# Patient Record
Sex: Female | Born: 1945 | Race: Black or African American | Hispanic: No | Smoking: Never smoker
Health system: Southern US, Community
[De-identification: ages and names within clinical notes are randomized; demographics above are authoritative.]

## PROBLEM LIST (undated history)

## (undated) DIAGNOSIS — R0600 Dyspnea, unspecified: Secondary | ICD-10-CM

## (undated) DIAGNOSIS — I10 Essential (primary) hypertension: Secondary | ICD-10-CM

## (undated) DIAGNOSIS — R079 Chest pain, unspecified: Secondary | ICD-10-CM

## (undated) DIAGNOSIS — M1990 Unspecified osteoarthritis, unspecified site: Secondary | ICD-10-CM

## (undated) DIAGNOSIS — M8580 Other specified disorders of bone density and structure, unspecified site: Secondary | ICD-10-CM

## (undated) DIAGNOSIS — N390 Urinary tract infection, site not specified: Secondary | ICD-10-CM

## (undated) DIAGNOSIS — K219 Gastro-esophageal reflux disease without esophagitis: Secondary | ICD-10-CM

## (undated) DIAGNOSIS — R739 Hyperglycemia, unspecified: Secondary | ICD-10-CM

## (undated) DIAGNOSIS — N871 Moderate cervical dysplasia: Secondary | ICD-10-CM

## (undated) HISTORY — DX: Essential (primary) hypertension: I10

## (undated) HISTORY — DX: Urinary tract infection, site not specified: N39.0

## (undated) HISTORY — DX: Gastro-esophageal reflux disease without esophagitis: K21.9

## (undated) HISTORY — PX: BREAST EXCISIONAL BIOPSY: SUR124

## (undated) HISTORY — PX: OOPHORECTOMY: SHX86

## (undated) HISTORY — DX: Chest pain, unspecified: R07.9

## (undated) HISTORY — PX: ABDOMINAL HYSTERECTOMY: SHX81

## (undated) HISTORY — DX: Unspecified osteoarthritis, unspecified site: M19.90

## (undated) HISTORY — DX: Other specified disorders of bone density and structure, unspecified site: M85.80

## (undated) HISTORY — DX: Hyperglycemia, unspecified: R73.9

## (undated) HISTORY — DX: Moderate cervical dysplasia: N87.1

---

## 1985-05-01 HISTORY — PX: BREAST BIOPSY: SHX20

## 2008-01-29 ENCOUNTER — Ambulatory Visit: Payer: Self-pay

## 2008-07-01 ENCOUNTER — Ambulatory Visit: Payer: Self-pay | Admitting: Family Medicine

## 2008-09-14 ENCOUNTER — Ambulatory Visit: Payer: Self-pay | Admitting: Orthopedic Surgery

## 2009-07-27 ENCOUNTER — Ambulatory Visit: Payer: Self-pay | Admitting: Family Medicine

## 2009-08-02 ENCOUNTER — Ambulatory Visit: Payer: Self-pay | Admitting: Otolaryngology

## 2010-05-18 ENCOUNTER — Ambulatory Visit: Payer: Self-pay | Admitting: Ophthalmology

## 2010-06-29 ENCOUNTER — Ambulatory Visit: Payer: Self-pay | Admitting: Ophthalmology

## 2010-08-09 ENCOUNTER — Ambulatory Visit: Payer: Self-pay | Admitting: Family Medicine

## 2011-08-10 ENCOUNTER — Ambulatory Visit: Payer: Self-pay | Admitting: Family Medicine

## 2012-04-19 ENCOUNTER — Ambulatory Visit: Payer: Self-pay | Admitting: Internal Medicine

## 2012-08-13 ENCOUNTER — Ambulatory Visit: Payer: Self-pay | Admitting: Family Medicine

## 2012-08-19 ENCOUNTER — Ambulatory Visit: Payer: Self-pay

## 2015-10-29 ENCOUNTER — Ambulatory Visit: Payer: Self-pay | Admitting: Oncology

## 2015-11-05 ENCOUNTER — Encounter: Payer: Self-pay | Admitting: Oncology

## 2015-11-05 ENCOUNTER — Inpatient Hospital Stay: Payer: Medicare Other | Attending: Oncology | Admitting: Oncology

## 2015-11-05 VITALS — BP 148/78 | HR 73 | Temp 98.1°F | Resp 18 | Ht 64.0 in | Wt 159.7 lb

## 2015-11-05 DIAGNOSIS — R739 Hyperglycemia, unspecified: Secondary | ICD-10-CM

## 2015-11-05 DIAGNOSIS — D72819 Decreased white blood cell count, unspecified: Secondary | ICD-10-CM | POA: Insufficient documentation

## 2015-11-05 DIAGNOSIS — N390 Urinary tract infection, site not specified: Secondary | ICD-10-CM

## 2015-11-05 DIAGNOSIS — G8929 Other chronic pain: Secondary | ICD-10-CM

## 2015-11-05 DIAGNOSIS — M79601 Pain in right arm: Secondary | ICD-10-CM | POA: Diagnosis not present

## 2015-11-05 DIAGNOSIS — K219 Gastro-esophageal reflux disease without esophagitis: Secondary | ICD-10-CM | POA: Diagnosis not present

## 2015-11-05 DIAGNOSIS — D696 Thrombocytopenia, unspecified: Secondary | ICD-10-CM | POA: Insufficient documentation

## 2015-11-05 DIAGNOSIS — D709 Neutropenia, unspecified: Secondary | ICD-10-CM | POA: Insufficient documentation

## 2015-11-05 DIAGNOSIS — M8580 Other specified disorders of bone density and structure, unspecified site: Secondary | ICD-10-CM | POA: Insufficient documentation

## 2015-11-05 DIAGNOSIS — M129 Arthropathy, unspecified: Secondary | ICD-10-CM | POA: Insufficient documentation

## 2015-11-05 DIAGNOSIS — M79602 Pain in left arm: Secondary | ICD-10-CM | POA: Insufficient documentation

## 2015-11-05 DIAGNOSIS — Z7982 Long term (current) use of aspirin: Secondary | ICD-10-CM | POA: Insufficient documentation

## 2015-11-05 DIAGNOSIS — I10 Essential (primary) hypertension: Secondary | ICD-10-CM | POA: Diagnosis not present

## 2015-11-05 DIAGNOSIS — R079 Chest pain, unspecified: Secondary | ICD-10-CM

## 2015-11-05 DIAGNOSIS — Z79899 Other long term (current) drug therapy: Secondary | ICD-10-CM

## 2015-11-05 DIAGNOSIS — Z8541 Personal history of malignant neoplasm of cervix uteri: Secondary | ICD-10-CM

## 2015-11-05 LAB — CBC WITH DIFFERENTIAL/PLATELET
Basophils Absolute: 0.1 K/uL (ref 0–0.1)
Basophils Relative: 1 %
Eosinophils Absolute: 0.2 K/uL (ref 0–0.7)
Eosinophils Relative: 3 %
HCT: 38.3 % (ref 35.0–47.0)
Hemoglobin: 12.6 g/dL (ref 12.0–16.0)
Lymphocytes Relative: 54 %
Lymphs Abs: 3.1 K/uL (ref 1.0–3.6)
MCH: 28.1 pg (ref 26.0–34.0)
MCHC: 32.8 g/dL (ref 32.0–36.0)
MCV: 85.6 fL (ref 80.0–100.0)
Monocytes Absolute: 0.6 K/uL (ref 0.2–0.9)
Monocytes Relative: 10 %
Neutro Abs: 1.8 K/uL (ref 1.4–6.5)
Neutrophils Relative %: 32 %
Platelets: 137 K/uL — ABNORMAL LOW (ref 150–440)
RBC: 4.47 MIL/uL (ref 3.80–5.20)
RDW: 16.4 % — ABNORMAL HIGH (ref 11.5–14.5)
WBC: 5.8 K/uL (ref 3.6–11.0)

## 2015-11-05 LAB — LACTATE DEHYDROGENASE: LDH: 163 U/L (ref 98–192)

## 2015-11-05 LAB — IRON AND TIBC
IRON: 62 ug/dL (ref 28–170)
SATURATION RATIOS: 19 % (ref 10.4–31.8)
TIBC: 335 ug/dL (ref 250–450)
UIBC: 273 ug/dL

## 2015-11-05 LAB — FERRITIN: Ferritin: 112 ng/mL (ref 11–307)

## 2015-11-05 NOTE — Progress Notes (Signed)
New evaluation for neutropenia. Pt complaints of bilateral arm pain that is chronic.

## 2015-11-06 NOTE — Progress Notes (Signed)
[facility]  [phone] [phone]) [phone]  ID: Lori Deleon OB: 09-Mar-1946  MR#: [id number]  MVE#:[id number]  No care team member to display  CHIEF COMPLAINT:  Chief Complaint  Patient presents with  . Neutropenia    INTERVAL HISTORY: Patient is a 70 year old female who was found to have a normal white blood cell count, but did have persistant neutropenia on routine blood work. She has bilateral arm pain that is chronic and unchanged but otherwise feels well and is asymptomatic. She has no neurologic complaints. She denies any recent fevers or illnesses. She has no new medications. She has a good appetite and denies weight loss. She denies any night sweats. She denies any nausea, vomiting, constipation, or diarrhea. She has no urinary complaints. Patient feels at her baseline and offers no specific complaints today.  REVIEW OF SYSTEMS:   Review of Systems  Constitutional: Negative.  Negative for fever, weight loss and malaise/fatigue.  Respiratory: Negative.  Negative for cough and shortness of breath.   Cardiovascular: Negative.  Negative for chest pain.  Gastrointestinal: Negative.  Negative for abdominal pain.  Genitourinary: Negative.  Negative for dysuria and flank pain.  Musculoskeletal: Positive for joint pain.  Neurological: Negative.  Negative for weakness.  Psychiatric/Behavioral: Negative.  The patient is not nervous/anxious.     As per HPI. Otherwise, a complete review of systems is negatve.  PAST MEDICAL HISTORY: Past Medical History  Diagnosis Date  . Esophageal reflux   . Osteopenia   . Hypertension   . GERD (gastroesophageal reflux disease)   . Arthritis   . Intermittent chest pain   . Hyperglycemia   . CIN II (cervical intraepithelial neoplasia II)   . UTI (lower urinary tract infection)     PAST SURGICAL HISTORY: No past surgical history on file.  FAMILY HISTORY: Reviewed and unchanged. No reported history of  malignancy or chronic disease.     ADVANCED DIRECTIVES:    HEALTH MAINTENANCE: Social History  Substance Use Topics  . Smoking status: Not on file  . Smokeless tobacco: Not on file  . Alcohol Use: Not on file     Colonoscopy:  PAP:  Bone density:  Lipid panel:  Allergies  Allergen Reactions  . Sulfa Antibiotics Rash    Current Outpatient Prescriptions  Medication Sig Dispense Refill  . amLODipine (NORVASC) 10 MG tablet Take 1 tablet by mouth daily.    Marland Kitchen aspirin 81 MG tablet Take 81 mg by mouth daily.    . calcium carbonate (OS-CAL) 600 MG TABS tablet Take 600 mg by mouth daily with breakfast.    . Cholecalciferol (VITAMIN D-3) 1000 units CAPS Take 1 capsule by mouth daily.    Marland Kitchen dexlansoprazole (DEXILANT) 60 MG capsule Take 60 mg by mouth daily.    . hydroxypropyl methylcellulose / hypromellose (ISOPTO TEARS / GONIOVISC) 2.5 % ophthalmic solution Place 2 drops into both eyes as needed for dry eyes.    . Multiple Vitamin (MULTIVITAMIN) tablet Take 1 tablet by mouth daily.    Marland Kitchen omega-3 fish oil (MAXEPA) 1000 MG CAPS capsule Take 1 capsule by mouth 2 (two) times daily.    . ranitidine (ZANTAC) 150 MG tablet Take 300 mg by mouth at bedtime.     No current facility-administered medications for this visit.    OBJECTIVE: Filed Vitals:   11/05/15 1432  BP: 148/78  Pulse: 73  Temp: 98.1 F (36.7 C)  Resp: 18     Body mass index is 27.4 kg/(m^2).  ECOG FS:0 - Asymptomatic  General: Well-developed, well-nourished, no acute distress. Eyes: Pink conjunctiva, anicteric sclera. HEENT: Normocephalic, moist mucous membranes, clear oropharnyx. Lungs: Clear to auscultation bilaterally. Heart: Regular rate and rhythm. No rubs, murmurs, or gallops. Abdomen: Soft, nontender, nondistended. No organomegaly noted, normoactive bowel sounds. Musculoskeletal: No edema, cyanosis, or clubbing. Neuro: Alert, answering all questions appropriately. Cranial nerves grossly intact. Skin: No  rashes or petechiae noted. Psych: Normal affect. Lymphatics: No cervical, calvicular, axillary or inguinal LAD.   LAB RESULTS:  No results found for: NA, K, CL, CO2, GLUCOSE, BUN, CREATININE, CALCIUM, PROT, ALBUMIN, AST, ALT, ALKPHOS, BILITOT, GFRNONAA, GFRAA  Lab Results  Component Value Date   WBC 5.8 11/05/2015   NEUTROABS 1.8 11/05/2015   HGB 12.6 11/05/2015   HCT 38.3 11/05/2015   MCV 85.6 11/05/2015   PLT 137* 11/05/2015     STUDIES: No results found.  ASSESSMENT: Neutropenia without leukopenia.  PLAN:    1. Neutropenia: Patient's white blood cell count continues to be within normal limits with a mild neutropenia. Patient is asymptomatic. All of her blood work is either negative or within normal limits. Peripheral blood flow cytometry as well as antineutrophil antibodies are pending at time of dictation. No intervention is needed at this time. Patient does not require bone marrow biopsy. Return to clinic in 3 months with repeat laboratory work and further evaluation. 2. Thrombocytopenia: Mild, monitor. 3. Hypertension: Patient's blood pressure is mildly elevated today, continue current medications as prescribed.  Patient expressed understanding and was in agreement with this plan. She also understands that She can call clinic at any time with any questions, concerns, or complaints.    Lloyd Huger, MD   11/06/2015 7:23 AM

## 2015-11-12 LAB — COMP PANEL: LEUKEMIA/LYMPHOMA

## 2015-11-16 LAB — NEUTROPHIL AB TEST LEVEL 1: NEUTROPHIL SCR/PANEL INTERP.: NEGATIVE

## 2015-12-08 ENCOUNTER — Other Ambulatory Visit: Payer: Self-pay | Admitting: Family Medicine

## 2015-12-08 DIAGNOSIS — Z1231 Encounter for screening mammogram for malignant neoplasm of breast: Secondary | ICD-10-CM

## 2015-12-27 ENCOUNTER — Other Ambulatory Visit: Payer: Self-pay | Admitting: Family Medicine

## 2015-12-27 ENCOUNTER — Ambulatory Visit
Admission: RE | Admit: 2015-12-27 | Discharge: 2015-12-27 | Disposition: A | Discharge status: Home / Self Care | Payer: Medicare Other | Source: Ambulatory Visit | Attending: Family Medicine | Admitting: Family Medicine

## 2015-12-27 DIAGNOSIS — Z1231 Encounter for screening mammogram for malignant neoplasm of breast: Secondary | ICD-10-CM | POA: Diagnosis present

## 2016-02-02 NOTE — Progress Notes (Signed)
[name]  [phone] [phone]  ID: Lori Deleon OB: [birth year]  MR#: [id number]  QQV#:[id number]  No care team member to display  CHIEF COMPLAINT: Neutropenia without leukopenia.  INTERVAL HISTORY: Patient returns to clinic today for repeat laboratory work and further evaluation. She currently feels well and is asymptomatic. She has no neurologic complaints. She denies any recent fevers or illnesses. She has a good appetite and denies weight loss. She denies any night sweats. She denies any nausea, vomiting, constipation, or diarrhea. She has no urinary complaints. Patient feels at her baseline and offers no specific complaints today.  REVIEW OF SYSTEMS:   Review of Systems  Constitutional: Negative.  Negative for fever, malaise/fatigue and weight loss.  Respiratory: Negative.  Negative for cough and shortness of breath.   Cardiovascular: Negative.  Negative for chest pain.  Gastrointestinal: Negative.  Negative for abdominal pain.  Genitourinary: Negative.  Negative for dysuria and flank pain.  Musculoskeletal: Positive for joint pain.  Neurological: Negative.  Negative for weakness.  Psychiatric/Behavioral: Negative.  The patient is not nervous/anxious.     As per HPI. Otherwise, a complete review of systems is negative.  PAST MEDICAL HISTORY: Past Medical History:  Diagnosis Date  . Arthritis   . CIN II (cervical intraepithelial neoplasia II)   . Esophageal reflux   . GERD (gastroesophageal reflux disease)   . Hyperglycemia   . Hypertension   . Intermittent chest pain   . Osteopenia   . UTI (lower urinary tract infection)     PAST SURGICAL HISTORY: Past Surgical History:  Procedure Laterality Date  . BREAST BIOPSY Left 1987   neg    FAMILY HISTORY: Reviewed and unchanged. No reported history of malignancy or chronic disease.     ADVANCED DIRECTIVES:    HEALTH MAINTENANCE: Social History  Substance Use Topics    . Smoking status: Not on file  . Smokeless tobacco: Not on file  . Alcohol use Not on file     Colonoscopy:  PAP:  Bone density:  Lipid panel:  Allergies  Allergen Reactions  . Sulfa Antibiotics Rash    Current Outpatient Prescriptions  Medication Sig Dispense Refill  . amLODipine (NORVASC) 10 MG tablet Take 1 tablet by mouth daily.    Marland Kitchen aspirin 81 MG tablet Take 81 mg by mouth daily.    . calcium carbonate (OS-CAL) 600 MG TABS tablet Take 600 mg by mouth daily with breakfast.    . Cholecalciferol (VITAMIN D-3) 1000 units CAPS Take 1 capsule by mouth daily.    Marland Kitchen dexlansoprazole (DEXILANT) 60 MG capsule Take 60 mg by mouth daily.    . hydroxypropyl methylcellulose / hypromellose (ISOPTO TEARS / GONIOVISC) 2.5 % ophthalmic solution Place 2 drops into both eyes as needed for dry eyes.    . Multiple Vitamin (MULTIVITAMIN) tablet Take 1 tablet by mouth daily.    Marland Kitchen omega-3 fish oil (MAXEPA) 1000 MG CAPS capsule Take 1 capsule by mouth 2 (two) times daily.    . ranitidine (ZANTAC) 150 MG tablet Take 300 mg by mouth at bedtime.     No current facility-administered medications for this visit.     OBJECTIVE: There were no vitals filed for this visit.   There is no height or weight on file to calculate BMI.    ECOG FS:0 - Asymptomatic  General: Well-developed, well-nourished, no acute distress. Eyes: Pink conjunctiva, anicteric sclera. Lungs: Clear to auscultation bilaterally. Heart: Regular rate and rhythm. No rubs, murmurs, or gallops.  Abdomen: Soft, nontender, nondistended. No organomegaly noted, normoactive bowel sounds. Musculoskeletal: No edema, cyanosis, or clubbing. Neuro: Alert, answering all questions appropriately. Cranial nerves grossly intact. Skin: No rashes or petechiae noted. Psych: Normal affect. Lymphatics: No cervical, calvicular, axillary or inguinal LAD.   LAB RESULTS:  No results found for: NA, K, CL, CO2, GLUCOSE, BUN, CREATININE, CALCIUM, PROT, ALBUMIN,  AST, ALT, ALKPHOS, BILITOT, GFRNONAA, GFRAA  Lab Results  Component Value Date   WBC 5.4 02/04/2016   NEUTROABS 1.4 02/04/2016   HGB 13.7 02/04/2016   HCT 41.5 02/04/2016   MCV 84.6 02/04/2016   PLT 139 (L) 02/04/2016     STUDIES: No results found.  ASSESSMENT: Neutropenia without leukopenia.  PLAN:    1. Neutropenia without leukopenia: Patient's white blood cell count continues to be within normal limits with a mild neutropenia. Patient is asymptomatic. All of her blood work is either negative or within normal limits, Including peripheral blood flow cytometry and antineutrophil antibodies. No intervention is needed at this time. Patient does not require bone marrow biopsy. After discussion with the patient, it was agreed upon that no further follow-up is necessary. Please refer patient back if she becomes symptomatic or there are any questions or concerns. 2. Thrombocytopenia: Mild, monitor. 3. Hypertension: Patient's blood pressure is mildly elevated today, continue current medications as prescribed.  Patient expressed understanding and was in agreement with this plan. She also understands that She can call clinic at any time with any questions, concerns, or complaints.    Lloyd Huger, MD   02/06/2016 5:30 PM

## 2016-02-04 ENCOUNTER — Inpatient Hospital Stay: Payer: Medicare Other | Attending: Oncology

## 2016-02-04 ENCOUNTER — Inpatient Hospital Stay (HOSPITAL_BASED_OUTPATIENT_CLINIC_OR_DEPARTMENT_OTHER): Payer: Medicare Other | Admitting: Oncology

## 2016-02-04 DIAGNOSIS — D696 Thrombocytopenia, unspecified: Secondary | ICD-10-CM | POA: Insufficient documentation

## 2016-02-04 DIAGNOSIS — E785 Hyperlipidemia, unspecified: Secondary | ICD-10-CM | POA: Insufficient documentation

## 2016-02-04 DIAGNOSIS — Z7982 Long term (current) use of aspirin: Secondary | ICD-10-CM

## 2016-02-04 DIAGNOSIS — K219 Gastro-esophageal reflux disease without esophagitis: Secondary | ICD-10-CM | POA: Diagnosis not present

## 2016-02-04 DIAGNOSIS — Z87440 Personal history of urinary (tract) infections: Secondary | ICD-10-CM

## 2016-02-04 DIAGNOSIS — M129 Arthropathy, unspecified: Secondary | ICD-10-CM | POA: Diagnosis not present

## 2016-02-04 DIAGNOSIS — R079 Chest pain, unspecified: Secondary | ICD-10-CM | POA: Insufficient documentation

## 2016-02-04 DIAGNOSIS — N871 Moderate cervical dysplasia: Secondary | ICD-10-CM | POA: Diagnosis not present

## 2016-02-04 DIAGNOSIS — M8580 Other specified disorders of bone density and structure, unspecified site: Secondary | ICD-10-CM | POA: Insufficient documentation

## 2016-02-04 DIAGNOSIS — Z79899 Other long term (current) drug therapy: Secondary | ICD-10-CM | POA: Diagnosis not present

## 2016-02-04 DIAGNOSIS — I10 Essential (primary) hypertension: Secondary | ICD-10-CM | POA: Insufficient documentation

## 2016-02-04 DIAGNOSIS — D72819 Decreased white blood cell count, unspecified: Secondary | ICD-10-CM

## 2016-02-04 DIAGNOSIS — D709 Neutropenia, unspecified: Secondary | ICD-10-CM | POA: Insufficient documentation

## 2016-02-04 LAB — CBC WITH DIFFERENTIAL/PLATELET
Basophils Absolute: 0 10*3/uL (ref 0–0.1)
Basophils Relative: 1 %
Eosinophils Absolute: 0.2 10*3/uL (ref 0–0.7)
Eosinophils Relative: 4 %
HEMATOCRIT: 41.5 % (ref 35.0–47.0)
Hemoglobin: 13.7 g/dL (ref 12.0–16.0)
LYMPHS ABS: 3.3 10*3/uL (ref 1.0–3.6)
LYMPHS PCT: 60 %
MCH: 27.9 pg (ref 26.0–34.0)
MCHC: 33 g/dL (ref 32.0–36.0)
MCV: 84.6 fL (ref 80.0–100.0)
MONO ABS: 0.5 10*3/uL (ref 0.2–0.9)
MONOS PCT: 9 %
NEUTROS ABS: 1.4 10*3/uL (ref 1.4–6.5)
Neutrophils Relative %: 26 %
Platelets: 139 10*3/uL — ABNORMAL LOW (ref 150–440)
RBC: 4.91 MIL/uL (ref 3.80–5.20)
RDW: 16 % — AB (ref 11.5–14.5)
WBC: 5.4 10*3/uL (ref 3.6–11.0)

## 2016-02-04 NOTE — Progress Notes (Signed)
Offers no complaints. States is feeling well. 

## 2016-05-16 DIAGNOSIS — I10 Essential (primary) hypertension: Secondary | ICD-10-CM | POA: Diagnosis not present

## 2016-05-25 DIAGNOSIS — Z1211 Encounter for screening for malignant neoplasm of colon: Secondary | ICD-10-CM | POA: Diagnosis not present

## 2016-05-25 DIAGNOSIS — Z86010 Personal history of colonic polyps: Secondary | ICD-10-CM | POA: Diagnosis not present

## 2016-05-25 DIAGNOSIS — Z7982 Long term (current) use of aspirin: Secondary | ICD-10-CM | POA: Diagnosis not present

## 2016-05-25 DIAGNOSIS — D123 Benign neoplasm of transverse colon: Secondary | ICD-10-CM | POA: Diagnosis not present

## 2016-05-25 DIAGNOSIS — K5730 Diverticulosis of large intestine without perforation or abscess without bleeding: Secondary | ICD-10-CM | POA: Diagnosis not present

## 2016-05-25 DIAGNOSIS — D369 Benign neoplasm, unspecified site: Secondary | ICD-10-CM | POA: Diagnosis not present

## 2016-05-25 DIAGNOSIS — I10 Essential (primary) hypertension: Secondary | ICD-10-CM | POA: Diagnosis not present

## 2016-05-31 DIAGNOSIS — I10 Essential (primary) hypertension: Secondary | ICD-10-CM | POA: Diagnosis not present

## 2016-05-31 DIAGNOSIS — M8580 Other specified disorders of bone density and structure, unspecified site: Secondary | ICD-10-CM | POA: Diagnosis not present

## 2016-06-01 DIAGNOSIS — R9431 Abnormal electrocardiogram [ECG] [EKG]: Secondary | ICD-10-CM | POA: Diagnosis not present

## 2016-06-01 DIAGNOSIS — Z87448 Personal history of other diseases of urinary system: Secondary | ICD-10-CM | POA: Diagnosis not present

## 2016-06-01 DIAGNOSIS — R7989 Other specified abnormal findings of blood chemistry: Secondary | ICD-10-CM | POA: Diagnosis not present

## 2016-06-01 DIAGNOSIS — I10 Essential (primary) hypertension: Secondary | ICD-10-CM | POA: Diagnosis not present

## 2016-06-01 DIAGNOSIS — R0789 Other chest pain: Secondary | ICD-10-CM | POA: Diagnosis not present

## 2016-06-01 DIAGNOSIS — M8580 Other specified disorders of bone density and structure, unspecified site: Secondary | ICD-10-CM | POA: Diagnosis not present

## 2016-06-15 DIAGNOSIS — I10 Essential (primary) hypertension: Secondary | ICD-10-CM | POA: Diagnosis not present

## 2016-06-15 DIAGNOSIS — R739 Hyperglycemia, unspecified: Secondary | ICD-10-CM | POA: Diagnosis not present

## 2016-06-15 DIAGNOSIS — R7989 Other specified abnormal findings of blood chemistry: Secondary | ICD-10-CM | POA: Diagnosis not present

## 2016-06-15 DIAGNOSIS — Z87448 Personal history of other diseases of urinary system: Secondary | ICD-10-CM | POA: Diagnosis not present

## 2016-06-15 DIAGNOSIS — R0789 Other chest pain: Secondary | ICD-10-CM | POA: Diagnosis not present

## 2016-06-23 DIAGNOSIS — R0789 Other chest pain: Secondary | ICD-10-CM | POA: Diagnosis not present

## 2016-06-23 DIAGNOSIS — R7989 Other specified abnormal findings of blood chemistry: Secondary | ICD-10-CM | POA: Diagnosis not present

## 2016-06-23 DIAGNOSIS — Z87448 Personal history of other diseases of urinary system: Secondary | ICD-10-CM | POA: Diagnosis not present

## 2016-06-23 DIAGNOSIS — I10 Essential (primary) hypertension: Secondary | ICD-10-CM | POA: Diagnosis not present

## 2016-06-23 DIAGNOSIS — R739 Hyperglycemia, unspecified: Secondary | ICD-10-CM | POA: Diagnosis not present

## 2016-07-17 DIAGNOSIS — I447 Left bundle-branch block, unspecified: Secondary | ICD-10-CM | POA: Diagnosis not present

## 2016-12-08 ENCOUNTER — Other Ambulatory Visit: Payer: Self-pay | Admitting: Family Medicine

## 2016-12-08 DIAGNOSIS — Z1231 Encounter for screening mammogram for malignant neoplasm of breast: Secondary | ICD-10-CM

## 2016-12-22 DIAGNOSIS — N644 Mastodynia: Secondary | ICD-10-CM | POA: Diagnosis not present

## 2016-12-22 DIAGNOSIS — Z0142 Encounter for cervical smear to confirm findings of recent normal smear following initial abnormal smear: Secondary | ICD-10-CM | POA: Diagnosis not present

## 2016-12-22 DIAGNOSIS — Z1272 Encounter for screening for malignant neoplasm of vagina: Secondary | ICD-10-CM | POA: Diagnosis not present

## 2016-12-22 DIAGNOSIS — Z1151 Encounter for screening for human papillomavirus (HPV): Secondary | ICD-10-CM | POA: Diagnosis not present

## 2016-12-27 DIAGNOSIS — R319 Hematuria, unspecified: Secondary | ICD-10-CM | POA: Diagnosis not present

## 2016-12-27 DIAGNOSIS — E782 Mixed hyperlipidemia: Secondary | ICD-10-CM | POA: Diagnosis not present

## 2016-12-27 DIAGNOSIS — I10 Essential (primary) hypertension: Secondary | ICD-10-CM | POA: Diagnosis not present

## 2016-12-27 DIAGNOSIS — R7989 Other specified abnormal findings of blood chemistry: Secondary | ICD-10-CM | POA: Diagnosis not present

## 2016-12-27 DIAGNOSIS — R739 Hyperglycemia, unspecified: Secondary | ICD-10-CM | POA: Diagnosis not present

## 2016-12-29 ENCOUNTER — Ambulatory Visit
Admission: RE | Admit: 2016-12-29 | Discharge: 2016-12-29 | Disposition: A | Discharge status: Home / Self Care | Payer: Medicare HMO | Source: Ambulatory Visit | Attending: Family Medicine | Admitting: Family Medicine

## 2016-12-29 DIAGNOSIS — Z1231 Encounter for screening mammogram for malignant neoplasm of breast: Secondary | ICD-10-CM | POA: Insufficient documentation

## 2017-01-10 DIAGNOSIS — R51 Headache: Secondary | ICD-10-CM | POA: Diagnosis not present

## 2017-01-10 DIAGNOSIS — R05 Cough: Secondary | ICD-10-CM | POA: Diagnosis not present

## 2017-01-10 DIAGNOSIS — R319 Hematuria, unspecified: Secondary | ICD-10-CM | POA: Diagnosis not present

## 2017-01-10 DIAGNOSIS — E782 Mixed hyperlipidemia: Secondary | ICD-10-CM | POA: Diagnosis not present

## 2017-01-10 DIAGNOSIS — R7989 Other specified abnormal findings of blood chemistry: Secondary | ICD-10-CM | POA: Diagnosis not present

## 2017-01-10 DIAGNOSIS — R739 Hyperglycemia, unspecified: Secondary | ICD-10-CM | POA: Diagnosis not present

## 2017-02-17 ENCOUNTER — Encounter (HOSPITAL_COMMUNITY): Payer: Self-pay | Admitting: Emergency Medicine

## 2017-02-17 ENCOUNTER — Inpatient Hospital Stay (HOSPITAL_COMMUNITY)
Admission: EM | Admit: 2017-02-17 | Discharge: 2017-02-20 | DRG: 563 | Disposition: A | Discharge status: Home / Self Care | Payer: Medicare HMO | Attending: Physician Assistant | Admitting: Physician Assistant

## 2017-02-17 ENCOUNTER — Emergency Department (HOSPITAL_COMMUNITY): Payer: Medicare HMO

## 2017-02-17 DIAGNOSIS — Z853 Personal history of malignant neoplasm of breast: Secondary | ICD-10-CM

## 2017-02-17 DIAGNOSIS — Z9889 Other specified postprocedural states: Secondary | ICD-10-CM | POA: Diagnosis not present

## 2017-02-17 DIAGNOSIS — S99922A Unspecified injury of left foot, initial encounter: Secondary | ICD-10-CM | POA: Diagnosis not present

## 2017-02-17 DIAGNOSIS — S42402A Unspecified fracture of lower end of left humerus, initial encounter for closed fracture: Secondary | ICD-10-CM | POA: Diagnosis not present

## 2017-02-17 DIAGNOSIS — S52501A Unspecified fracture of the lower end of right radius, initial encounter for closed fracture: Secondary | ICD-10-CM

## 2017-02-17 DIAGNOSIS — Z87410 Personal history of cervical dysplasia: Secondary | ICD-10-CM | POA: Diagnosis not present

## 2017-02-17 DIAGNOSIS — S52571A Other intraarticular fracture of lower end of right radius, initial encounter for closed fracture: Principal | ICD-10-CM | POA: Diagnosis present

## 2017-02-17 DIAGNOSIS — M79601 Pain in right arm: Secondary | ICD-10-CM | POA: Diagnosis not present

## 2017-02-17 DIAGNOSIS — S42302A Unspecified fracture of shaft of humerus, left arm, initial encounter for closed fracture: Secondary | ICD-10-CM | POA: Diagnosis not present

## 2017-02-17 DIAGNOSIS — S42002A Fracture of unspecified part of left clavicle, initial encounter for closed fracture: Secondary | ICD-10-CM | POA: Diagnosis not present

## 2017-02-17 DIAGNOSIS — S0993XA Unspecified injury of face, initial encounter: Secondary | ICD-10-CM | POA: Diagnosis not present

## 2017-02-17 DIAGNOSIS — Z79899 Other long term (current) drug therapy: Secondary | ICD-10-CM

## 2017-02-17 DIAGNOSIS — Y92015 Private garage of single-family (private) house as the place of occurrence of the external cause: Secondary | ICD-10-CM | POA: Diagnosis not present

## 2017-02-17 DIAGNOSIS — W1789XA Other fall from one level to another, initial encounter: Secondary | ICD-10-CM | POA: Diagnosis present

## 2017-02-17 DIAGNOSIS — W19XXXA Unspecified fall, initial encounter: Secondary | ICD-10-CM

## 2017-02-17 DIAGNOSIS — R079 Chest pain, unspecified: Secondary | ICD-10-CM | POA: Diagnosis not present

## 2017-02-17 DIAGNOSIS — Z882 Allergy status to sulfonamides status: Secondary | ICD-10-CM

## 2017-02-17 DIAGNOSIS — S22000A Wedge compression fracture of unspecified thoracic vertebra, initial encounter for closed fracture: Secondary | ICD-10-CM | POA: Diagnosis not present

## 2017-02-17 DIAGNOSIS — R52 Pain, unspecified: Secondary | ICD-10-CM

## 2017-02-17 DIAGNOSIS — S3991XA Unspecified injury of abdomen, initial encounter: Secondary | ICD-10-CM | POA: Diagnosis not present

## 2017-02-17 DIAGNOSIS — M25531 Pain in right wrist: Secondary | ICD-10-CM | POA: Diagnosis not present

## 2017-02-17 DIAGNOSIS — E872 Acidosis, unspecified: Secondary | ICD-10-CM

## 2017-02-17 DIAGNOSIS — T148XXA Other injury of unspecified body region, initial encounter: Secondary | ICD-10-CM | POA: Diagnosis not present

## 2017-02-17 DIAGNOSIS — M7989 Other specified soft tissue disorders: Secondary | ICD-10-CM | POA: Diagnosis not present

## 2017-02-17 DIAGNOSIS — K219 Gastro-esophageal reflux disease without esophagitis: Secondary | ICD-10-CM | POA: Diagnosis not present

## 2017-02-17 DIAGNOSIS — S199XXA Unspecified injury of neck, initial encounter: Secondary | ICD-10-CM | POA: Diagnosis not present

## 2017-02-17 DIAGNOSIS — I10 Essential (primary) hypertension: Secondary | ICD-10-CM | POA: Diagnosis present

## 2017-02-17 DIAGNOSIS — E782 Mixed hyperlipidemia: Secondary | ICD-10-CM | POA: Diagnosis not present

## 2017-02-17 DIAGNOSIS — R55 Syncope and collapse: Secondary | ICD-10-CM | POA: Diagnosis not present

## 2017-02-17 DIAGNOSIS — S0990XA Unspecified injury of head, initial encounter: Secondary | ICD-10-CM | POA: Diagnosis not present

## 2017-02-17 DIAGNOSIS — S42001A Fracture of unspecified part of right clavicle, initial encounter for closed fracture: Secondary | ICD-10-CM | POA: Diagnosis not present

## 2017-02-17 DIAGNOSIS — M79641 Pain in right hand: Secondary | ICD-10-CM | POA: Diagnosis not present

## 2017-02-17 DIAGNOSIS — S42022A Displaced fracture of shaft of left clavicle, initial encounter for closed fracture: Secondary | ICD-10-CM | POA: Diagnosis not present

## 2017-02-17 DIAGNOSIS — S22009A Unspecified fracture of unspecified thoracic vertebra, initial encounter for closed fracture: Secondary | ICD-10-CM

## 2017-02-17 DIAGNOSIS — R4020 Unspecified coma: Secondary | ICD-10-CM

## 2017-02-17 LAB — COMPREHENSIVE METABOLIC PANEL
ALBUMIN: 4.1 g/dL (ref 3.5–5.0)
ALT: 27 U/L (ref 14–54)
ANION GAP: 11 (ref 5–15)
AST: 42 U/L — ABNORMAL HIGH (ref 15–41)
Alkaline Phosphatase: 50 U/L (ref 38–126)
BILIRUBIN TOTAL: 1.1 mg/dL (ref 0.3–1.2)
BUN: 24 mg/dL — ABNORMAL HIGH (ref 6–20)
CO2: 22 mmol/L (ref 22–32)
Calcium: 8.9 mg/dL (ref 8.9–10.3)
Chloride: 102 mmol/L (ref 101–111)
Creatinine, Ser: 0.98 mg/dL (ref 0.44–1.00)
GFR calc Af Amer: >60 mL/min (ref >60)
GFR calc non Af Amer: 56 mL/min — ABNORMAL LOW (ref >60)
GLUCOSE: 162 mg/dL — AB (ref 65–99)
POTASSIUM: 3.5 mmol/L (ref 3.5–5.1)
SODIUM: 135 mmol/L (ref 135–145)
TOTAL PROTEIN: 7 g/dL (ref 6.5–8.1)

## 2017-02-17 LAB — I-STAT CHEM 8, ED
BUN: 34 mg/dL — AB (ref 6–20)
Calcium, Ion: 1.11 mmol/L — ABNORMAL LOW (ref 1.15–1.40)
Chloride: 101 mmol/L (ref 101–111)
Creatinine, Ser: 0.9 mg/dL (ref 0.44–1.00)
Glucose, Bld: 162 mg/dL — ABNORMAL HIGH (ref 65–99)
HCT: 43 % (ref 36.0–46.0)
Hemoglobin: 14.6 g/dL (ref 12.0–15.0)
Potassium: 4 mmol/L (ref 3.5–5.1)
SODIUM: 139 mmol/L (ref 135–145)
TCO2: 25 mmol/L (ref 22–32)

## 2017-02-17 LAB — PROTIME-INR
INR: 1.15
PROTHROMBIN TIME: 14.6 s (ref 11.4–15.2)

## 2017-02-17 LAB — URINALYSIS, ROUTINE W REFLEX MICROSCOPIC
Bilirubin Urine: NEGATIVE
Glucose, UA: 50 mg/dL — AB
Hgb urine dipstick: NEGATIVE
Ketones, ur: NEGATIVE mg/dL
LEUKOCYTES UA: NEGATIVE
Nitrite: NEGATIVE
PROTEIN: NEGATIVE mg/dL
SPECIFIC GRAVITY, URINE: 1.015 (ref 1.005–1.030)
pH: 6 (ref 5.0–8.0)

## 2017-02-17 LAB — CBC
HCT: 37.9 % (ref 36.0–46.0)
HEMOGLOBIN: 12.8 g/dL (ref 12.0–15.0)
MCH: 29.2 pg (ref 26.0–34.0)
MCHC: 33.8 g/dL (ref 30.0–36.0)
MCV: 86.3 fL (ref 78.0–100.0)
Platelets: 101 10*3/uL — ABNORMAL LOW (ref 150–400)
RBC: 4.39 MIL/uL (ref 3.87–5.11)
RDW: 15.7 % — ABNORMAL HIGH (ref 11.5–15.5)
WBC: 9.9 10*3/uL (ref 4.0–10.5)

## 2017-02-17 LAB — I-STAT CG4 LACTIC ACID, ED
LACTIC ACID, VENOUS: 3.77 mmol/L — AB (ref 0.5–1.9)
LACTIC ACID, VENOUS: 4.08 mmol/L — AB (ref 0.5–1.9)

## 2017-02-17 LAB — ETHANOL

## 2017-02-17 MED ORDER — FENTANYL CITRATE (PF) 100 MCG/2ML IJ SOLN
50.0000 ug | Freq: Once | INTRAMUSCULAR | Status: AC
  Administered 2017-02-17: 50 ug via INTRAVENOUS
  Filled 2017-02-17: qty 2

## 2017-02-17 MED ORDER — OXYCODONE HCL 5 MG PO TABS
5.0000 mg | ORAL_TABLET | ORAL | Status: DC | PRN
  Administered 2017-02-18 – 2017-02-20 (×4): 5 mg via ORAL
  Filled 2017-02-17 (×4): qty 1

## 2017-02-17 MED ORDER — ONDANSETRON 4 MG PO TBDP
4.0000 mg | ORAL_TABLET | Freq: Four times a day (QID) | ORAL | Status: DC | PRN
  Filled 2017-02-17: qty 1

## 2017-02-17 MED ORDER — ACETAMINOPHEN 325 MG PO TABS
650.0000 mg | ORAL_TABLET | ORAL | Status: DC | PRN
  Administered 2017-02-18: 650 mg via ORAL
  Filled 2017-02-17: qty 2

## 2017-02-17 MED ORDER — ONDANSETRON HCL 4 MG/2ML IJ SOLN
4.0000 mg | Freq: Once | INTRAMUSCULAR | Status: AC
  Administered 2017-02-17: 4 mg via INTRAVENOUS
  Filled 2017-02-17: qty 2

## 2017-02-17 MED ORDER — FENTANYL CITRATE (PF) 100 MCG/2ML IJ SOLN: 50.0000 ug | Freq: Once | INTRAMUSCULAR | Status: DC

## 2017-02-17 MED ORDER — IOPAMIDOL (ISOVUE-300) INJECTION 61%
INTRAVENOUS | Status: AC
  Administered 2017-02-17: 100 mL
  Filled 2017-02-17: qty 100

## 2017-02-17 MED ORDER — MORPHINE SULFATE (PF) 4 MG/ML IV SOLN
2.0000 mg | INTRAVENOUS | Status: DC | PRN
  Administered 2017-02-18 (×2): 2 mg via INTRAVENOUS
  Administered 2017-02-19 (×2): 4 mg via INTRAVENOUS
  Filled 2017-02-17 (×4): qty 1

## 2017-02-17 MED ORDER — METOPROLOL TARTRATE 5 MG/5ML IV SOLN: 5.0000 mg | Freq: Four times a day (QID) | INTRAVENOUS | Status: DC | PRN

## 2017-02-17 MED ORDER — ONDANSETRON HCL 4 MG/2ML IJ SOLN: 4.0000 mg | Freq: Four times a day (QID) | INTRAMUSCULAR | Status: DC | PRN

## 2017-02-17 MED ORDER — SODIUM CHLORIDE 0.9 % IV BOLUS (SEPSIS)
1000.0000 mL | Freq: Once | INTRAVENOUS | Status: AC
  Administered 2017-02-17: 1000 mL via INTRAVENOUS

## 2017-02-17 NOTE — Progress Notes (Signed)
Orthopedic Tech Progress Note Patient Details:  Lori Deleon December 18, 1945 [id number]  Ortho Devices Type of Ortho Device: Sling immobilizer, Post (long arm) splint, Volar splint Ortho Device/Splint Location: lue post long arm, rue volar, bi sling immobilizer Ortho Device/Splint Interventions: Ordered, Application, Adjustment   Karolee Stamps 02/17/2017, 9:09 PM

## 2017-02-17 NOTE — ED Provider Notes (Signed)
[facility] Provider Note   CSN: [id number] Arrival date & time: 02/17/17  1447     History   Chief Complaint Chief Complaint  Patient presents with  . Fall  . Loss of Consciousness  . Arm Injury    HPI Lori Deleon is a 71 y.o. female.  The history is provided by the patient, a relative and medical records.  Fall  This is a new problem. The current episode started less than 1 hour ago. The problem occurs constantly. The problem has not changed since onset.Associated symptoms include headaches. Pertinent negatives include no chest pain, no abdominal pain and no shortness of breath. Nothing aggravates the symptoms. Nothing relieves the symptoms. She has tried nothing for the symptoms. The treatment provided no relief.    Past Medical History:  Diagnosis Date  . Arthritis   . CIN II (cervical intraepithelial neoplasia II)   . Esophageal reflux   . GERD (gastroesophageal reflux disease)   . Hyperglycemia   . Hypertension   . Intermittent chest pain   . Osteopenia   . UTI (lower urinary tract infection)     Patient Active Problem List   Diagnosis Date Noted  . 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 BREAST BIOPSY Left 1987   neg    OB History    No data available       Home Medications    Prior to Admission medications   Medication Sig Start Date End Date Taking? Authorizing Provider  amLODipine (NORVASC) 10 MG tablet Take 1 tablet by mouth daily. 10/22/15   [provider]  aspirin 81 MG tablet Take 81 mg by mouth daily.    [provider]  calcium carbonate (OS-CAL) 600 MG TABS tablet Take 600 mg by mouth daily with breakfast.    [provider]  Cholecalciferol (VITAMIN D-3) 1000 units CAPS Take 1 capsule by mouth daily.    [provider]  dexlansoprazole (DEXILANT) 60 MG capsule Take 60 mg by mouth daily.    [provider]  hydroxypropyl methylcellulose / hypromellose (ISOPTO TEARS / GONIOVISC) 2.5 % ophthalmic solution Place 2 drops into both eyes as needed for dry eyes.    [provider]  Multiple Vitamin (MULTIVITAMIN) tablet Take 1 tablet by mouth daily.    [provider]  omega-3 fish oil (MAXEPA) 1000 MG CAPS capsule Take 1 capsule by mouth 2 (two) times daily.    [provider]  ranitidine (ZANTAC) 150 MG tablet Take 300 mg by mouth at bedtime.    [provider]    Family History Family History  Problem Relation Age of Onset  . Breast cancer Neg Hx     Social History Social History  Substance Use Topics  . Smoking status: Never Smoker  . Smokeless tobacco: Never Used  . Alcohol use No     Allergies   Sulfa antibiotics   Review of Systems Review of Systems  Constitutional: Negative for chills, fatigue and fever.  HENT: Negative for congestion.   Eyes: Negative for visual disturbance.  Respiratory: Negative for cough, chest tightness, shortness of breath, wheezing and stridor.   Cardiovascular: Negative for chest pain and palpitations.  Gastrointestinal: Negative for abdominal pain, constipation, diarrhea, nausea and vomiting.  Genitourinary: Negative for dysuria, flank pain and frequency.  Musculoskeletal: Positive for back pain and neck pain. Negative for neck stiffness.  Neurological: Positive for headaches. Negative  for dizziness, weakness, light-headedness and numbness.  Psychiatric/Behavioral: Negative for agitation.  All other systems reviewed and are negative.    Physical Exam Updated Vital Signs BP (!) 143/84 (BP Location: Right Arm)   Pulse 82   Temp (!) 97.5 F (36.4 C) (Oral)   Resp 14   Ht 5\' 4"  (1.626 m)   Wt 69.4 kg (153 lb)   SpO2 99%   BMI 26.26 kg/m   Physical Exam  Constitutional: She appears well-developed and well-nourished. No distress.  HENT:  Head: Normocephalic.  Nose: No sinus tenderness, nasal deformity  or septal deviation. Epistaxis is observed.    Mouth/Throat: Oropharynx is clear and moist. No oropharyngeal exudate.  Eyes: Pupils are equal, round, and reactive to light. Conjunctivae and EOM are normal.  Neck:    Pt in C collar   Cardiovascular: Normal rate and regular rhythm.   No murmur heard. Pulmonary/Chest: Effort normal and breath sounds normal. No respiratory distress.  Abdominal: Soft. There is no tenderness.  Musculoskeletal: She exhibits no edema.       Left elbow: She exhibits swelling and deformity. Tenderness found.       Right wrist: She exhibits tenderness and swelling.       Thoracic back: She exhibits tenderness and pain.       Lumbar back: She exhibits tenderness and pain.       Back:       Arms: Tenderness and swelling of the left elbow.  Pain with elbow range of motion.  Normal radial pulse grip strength and sensation in left hand.  Normal capillary refill.  Tenderness and swelling of right wrist.  Pain with range of motion.  Normal grip strength and sensation and capillary refill in the right hand.  Patient is right-handed.  Neurological: She is alert.  Skin: Skin is warm and dry. She is not diaphoretic.  Psychiatric: She has a normal mood and affect.  Nursing note and vitals reviewed.    ED Treatments / Results  Labs (all labs ordered are listed, but only abnormal results are displayed) Labs Reviewed  COMPREHENSIVE METABOLIC PANEL - Abnormal; Notable for the following:       Result Value   Glucose, Bld 162 (*)    BUN 24 (*)    AST 42 (*)    GFR calc non Af Amer 56 (*)    All other components within normal limits  CBC - Abnormal; Notable for the following:    RDW 15.7 (*)    Platelets 101 (*)    All other components within normal limits  URINALYSIS, ROUTINE W REFLEX MICROSCOPIC - Abnormal; Notable for the following:    Glucose, UA 50 (*)    All other components within normal limits  I-STAT CHEM 8, ED - Abnormal; Notable for the following:     BUN 34 (*)    Glucose, Bld 162 (*)    Calcium, Ion 1.11 (*)    All other components within normal limits  I-STAT CG4 LACTIC ACID, ED - Abnormal; Notable for the following:    Lactic Acid, Venous 3.77 (*)    All other components within normal limits  I-STAT CG4 LACTIC ACID, ED - Abnormal; Notable for the following:    Lactic Acid, Venous 4.08 (*)    All other components within normal limits  ETHANOL  PROTIME-INR  CBC  BASIC METABOLIC PANEL  LACTIC ACID, PLASMA  LACTIC ACID, PLASMA    EKG  EKG Interpretation None  Radiology Dg Elbow 2 Views Left  Result Date: 02/17/2017 CLINICAL DATA:  Recent fall with left elbow pain, initial encounter EXAM: LEFT ELBOW - 2 VIEW COMPARISON:  None FINDINGS: Comminuted fracture of the distal left humerus is noted with impaction at the fracture site. Fracture lines extend into joint space. No dislocation is noted. Considerable soft tissue swelling is noted. IMPRESSION: Comminuted distal left humeral fracture with impaction at the fracture site in extension into the articular surface. Electronically Signed   By: Inez Catalina M.D.   On: 02/17/2017 18:46   Dg Wrist Complete Right  Result Date: 02/17/2017 CLINICAL DATA:  Recent fall with wrist and hand pain, initial encounter EXAM: RIGHT WRIST - COMPLETE 3+ VIEW COMPARISON:  None. FINDINGS: Comminuted distal radial fracture is noted with extension into the articular space in multiple areas. The carpal bones appear intact. Mild posterior angulation at the fracture site is noted. Soft tissue swelling is seen. IMPRESSION: Comminuted distal radial fracture with multiple fracture lines extending to the articular surface. Electronically Signed   By: Inez Catalina M.D.   On: 02/17/2017 18:45   Ct Head Wo Contrast  Addendum Date: 02/17/2017   ADDENDUM REPORT: 02/17/2017 18:34 ADDENDUM: There is an acute appearing left transverse process fracture T1. Electronically Signed   By: Ashley Lori M.D.   On:  02/17/2017 18:34   Result Date: 02/17/2017 CLINICAL DATA:  Posttraumatic fall from attic today with loss of consciousness. EXAM: CT HEAD WITHOUT CONTRAST CT MAXILLOFACIAL WITHOUT CONTRAST CT CERVICAL SPINE WITHOUT CONTRAST TECHNIQUE: Multidetector CT imaging of the head, cervical spine, and maxillofacial structures were performed using the standard protocol without intravenous contrast. Multiplanar CT image reconstructions of the cervical spine and maxillofacial structures were also generated. COMPARISON:  MRI of the brain 08/02/2009 common cervical spine MRI 09/14/2008 FINDINGS: CT HEAD FINDINGS Brain: Mild superficial atrophy. No acute intracranial hemorrhage, midline shift or edema. No intra-axial mass nor extra-axial fluid collections. No hydrocephalus. No effacement of the basal cisterns or fourth ventricle. Minimal small vessel ischemic changes of periventricular white matter bilaterally. Vascular: No hyperdense vessels. Mild atherosclerosis of the carotid siphons right greater than left. Skull: No skull fracture or suspicious osseous lesions. Other: None CT MAXILLOFACIAL FINDINGS Osseous: No acute maxillofacial fracture. Orbits: Intact orbits and globes. No retrobulbar hematoma. Bilateral cataract extractions. Sinuses: Trace air-fluid level within the left sphenoid sinus with mild ethmoid and right maxillary sinus mucosal thickening. Soft tissues: Left premalar soft tissue contusion. CT CERVICAL SPINE FINDINGS Alignment: Slight straightening of cervical lordosis which may be due to positioning or muscle spasm. Intact atlantodental interval and craniocervical relationship. Skull base and vertebrae: No acute fracture. No primary bone lesion or focal pathologic process. Soft tissues and spinal canal: No prevertebral fluid or swelling. No visible canal hematoma. Disc levels: Moderate disc space narrowing at C6-7 with small posterior marginal osteophytes and uncovertebral joint osteoarthritis. Slight left-sided  neural foraminal encroachment from uncovertebral joint spurring at C6-7. Upper chest: Negative. Other: None IMPRESSION: 1. Minimal small vessel ischemic disease of periventricular white matter. No acute intracranial abnormality. 2. Soft tissue contusion of the left cheek. No maxillofacial fracture. 3. 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 No acute cervical spine fracture or posttraumatic cervical subluxation. Electronically Signed: By: Ashley Lori M.D. On: 02/17/2017 18:27   Ct Chest W Contrast  Result Date: 02/17/2017 CLINICAL DATA:  Fall.  Loss of consciousness.  Left arm pain. EXAM: CT CHEST, ABDOMEN, AND PELVIS WITH CONTRAST TECHNIQUE: Multidetector CT imaging of the chest,  abdomen and pelvis was performed following the standard protocol during bolus administration of intravenous contrast. CONTRAST:  122mL ISOVUE-300 IOPAMIDOL (ISOVUE-300) INJECTION 61% COMPARISON:  None. FINDINGS: CT CHEST FINDINGS Cardiovascular: Mild degradation secondary to extensive EKG wires and leads and resultant beam hardening artifact. Patient arm position also degraded exam. No evidence of aortic laceration or mediastinal hematoma. Tortuous thoracic aorta. Mild cardiomegaly, without pericardial effusion. Aortic atherosclerosis. Mediastinum/Nodes: No mediastinal or hilar adenopathy. Lungs/Pleura: No pleural fluid. No pneumothorax. 4 mm left upper lobe pulmonary nodule on image 41/ series 6. Minimal bibasilar subsegmental atelectasis dependently. Minimal posterior left upper lobe subpleural nodularity is likely due to a subpleural lymph node. No evidence of pulmonary contusion. Musculoskeletal: No chest wall hematoma. There is fluid, likely representing hemorrhage, within the low neck and supraclavicular regions bilaterally. Nondisplaced fracture involving the left transverse process of T1 on image 2/series 5. Mid right and distal left clavicular fractures. Both clavicular fractures are minimally  comminuted, but relatively mildly displaced. Left glenohumeral joint osteoarthritis. CT ABDOMEN PELVIS FINDINGS Hepatobiliary: Multifactorial degradation continuing into the upper abdomen. Normal liver. Normal gallbladder, without biliary ductal dilatation. Pancreas: Normal, without mass or ductal dilatation. Spleen: Normal in size, without focal abnormality. Adrenals/Urinary Tract: Normal adrenal glands. Scarred right kidney. Normal left kidney. Hyperattenuation within the collecting systems is favored to be due to early contrast excretion rather than collecting system calculi. Normal urinary bladder. Stomach/Bowel: Normal stomach, without wall thickening. Large amount of stool within the sigmoid and rectum. Normal terminal ileum and appendix. Normal small bowel. No free intraperitoneal air. Vascular/Lymphatic: Aortic and branch vessel atherosclerosis. No abdominopelvic adenopathy. Reproductive: Probable hysterectomy versus uterine atrophy. No adnexal mass. Other: No significant free fluid. Musculoskeletal: Extensive bruising about the lateral left abdominal and upper pelvic subcutaneous tissues. No well-defined hematoma or evidence of active extravasation. Degenerative irregularity about the symphysis pubis. lumbosacral spondylosis. IMPRESSION: 1. Bilateral clavicular fractures and a left T1 transverse process fracture. 2. Extensive bruising about the left abdominal and upper pelvic walls. No intraabdominal/pelvic posttraumatic abnormality identified. 3.  Aortic Atherosclerosis (ICD10-I70.0). 4. 4 mm left upper lobe pulmonary nodule. No follow-up needed if patient is low-risk. Non-contrast chest CT can be considered in 12 months if patient is high-risk. This recommendation follows the consensus statement: Guidelines for Management of Incidental Pulmonary Nodules Detected on CT Images: From the Fleischner Society 2017; Radiology 2017; 284:228-243. 5. Mild limitations as above. Electronically Signed   By: Abigail Miyamoto M.D.   On: 02/17/2017 18:34   Ct Cervical Spine Wo Contrast  Addendum Date: 02/17/2017   ADDENDUM REPORT: 02/17/2017 18:34 ADDENDUM: There is an acute appearing left transverse process fracture T1. Electronically Signed   By: Ashley Lori M.D.   On: 02/17/2017 18:34   Result Date: 02/17/2017 CLINICAL DATA:  Posttraumatic fall from attic today with loss of consciousness. EXAM: CT HEAD WITHOUT CONTRAST CT MAXILLOFACIAL WITHOUT CONTRAST CT CERVICAL SPINE WITHOUT CONTRAST TECHNIQUE: Multidetector CT imaging of the head, cervical spine, and maxillofacial structures were performed using the standard protocol without intravenous contrast. Multiplanar CT image reconstructions of the cervical spine and maxillofacial structures were also generated. COMPARISON:  MRI of the brain 08/02/2009 common cervical spine MRI 09/14/2008 FINDINGS: CT HEAD FINDINGS Brain: Mild superficial atrophy. No acute intracranial hemorrhage, midline shift or edema. No intra-axial mass nor extra-axial fluid collections. No hydrocephalus. No effacement of the basal cisterns or fourth ventricle. Minimal small vessel ischemic changes of periventricular white matter bilaterally. Vascular: No hyperdense vessels. Mild atherosclerosis of the carotid siphons right greater  than left. Skull: No skull fracture or suspicious osseous lesions. Other: None CT MAXILLOFACIAL FINDINGS Osseous: No acute maxillofacial fracture. Orbits: Intact orbits and globes. No retrobulbar hematoma. Bilateral cataract extractions. Sinuses: Trace air-fluid level within the left sphenoid sinus with mild ethmoid and right maxillary sinus mucosal thickening. Soft tissues: Left premalar soft tissue contusion. CT CERVICAL SPINE FINDINGS Alignment: Slight straightening of cervical lordosis which may be due to positioning or muscle spasm. Intact atlantodental interval and craniocervical relationship. Skull base and vertebrae: No acute fracture. No primary bone lesion or focal  pathologic process. Soft tissues and spinal canal: No prevertebral fluid or swelling. No visible canal hematoma. Disc levels: Moderate disc space narrowing at C6-7 with small posterior marginal osteophytes and uncovertebral joint osteoarthritis. Slight left-sided neural foraminal encroachment from uncovertebral joint spurring at C6-7. Upper chest: Negative. Other: None IMPRESSION: 1. Minimal small vessel ischemic disease of periventricular white matter. No acute intracranial abnormality. 2. Soft tissue contusion of the left cheek. No maxillofacial fracture. 3. 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 No acute cervical spine fracture or posttraumatic cervical subluxation. Electronically Signed: By: Ashley Lori M.D. On: 02/17/2017 18:27   Ct Abdomen Pelvis W Contrast  Result Date: 02/17/2017 CLINICAL DATA:  Fall.  Loss of consciousness.  Left arm pain. EXAM: CT CHEST, ABDOMEN, AND PELVIS WITH CONTRAST TECHNIQUE: Multidetector CT imaging of the chest, abdomen and pelvis was performed following the standard protocol during bolus administration of intravenous contrast. CONTRAST:  132mL ISOVUE-300 IOPAMIDOL (ISOVUE-300) INJECTION 61% COMPARISON:  None. FINDINGS: CT CHEST FINDINGS Cardiovascular: Mild degradation secondary to extensive EKG wires and leads and resultant beam hardening artifact. Patient arm position also degraded exam. No evidence of aortic laceration or mediastinal hematoma. Tortuous thoracic aorta. Mild cardiomegaly, without pericardial effusion. Aortic atherosclerosis. Mediastinum/Nodes: No mediastinal or hilar adenopathy. Lungs/Pleura: No pleural fluid. No pneumothorax. 4 mm left upper lobe pulmonary nodule on image 41/ series 6. Minimal bibasilar subsegmental atelectasis dependently. Minimal posterior left upper lobe subpleural nodularity is likely due to a subpleural lymph node. No evidence of pulmonary contusion. Musculoskeletal: No chest wall hematoma. There is  fluid, likely representing hemorrhage, within the low neck and supraclavicular regions bilaterally. Nondisplaced fracture involving the left transverse process of T1 on image 2/series 5. Mid right and distal left clavicular fractures. Both clavicular fractures are minimally comminuted, but relatively mildly displaced. Left glenohumeral joint osteoarthritis. CT ABDOMEN PELVIS FINDINGS Hepatobiliary: Multifactorial degradation continuing into the upper abdomen. Normal liver. Normal gallbladder, without biliary ductal dilatation. Pancreas: Normal, without mass or ductal dilatation. Spleen: Normal in size, without focal abnormality. Adrenals/Urinary Tract: Normal adrenal glands. Scarred right kidney. Normal left kidney. Hyperattenuation within the collecting systems is favored to be due to early contrast excretion rather than collecting system calculi. Normal urinary bladder. Stomach/Bowel: Normal stomach, without wall thickening. Large amount of stool within the sigmoid and rectum. Normal terminal ileum and appendix. Normal small bowel. No free intraperitoneal air. Vascular/Lymphatic: Aortic and branch vessel atherosclerosis. No abdominopelvic adenopathy. Reproductive: Probable hysterectomy versus uterine atrophy. No adnexal mass. Other: No significant free fluid. Musculoskeletal: Extensive bruising about the lateral left abdominal and upper pelvic subcutaneous tissues. No well-defined hematoma or evidence of active extravasation. Degenerative irregularity about the symphysis pubis. lumbosacral spondylosis. IMPRESSION: 1. Bilateral clavicular fractures and a left T1 transverse process fracture. 2. Extensive bruising about the left abdominal and upper pelvic walls. No intraabdominal/pelvic posttraumatic abnormality identified. 3.  Aortic Atherosclerosis (ICD10-I70.0). 4. 4 mm left upper lobe pulmonary nodule. No follow-up needed if patient  is low-risk. Non-contrast chest CT can be considered in 12 months if patient is  high-risk. This recommendation follows the consensus statement: Guidelines for Management of Incidental Pulmonary Nodules Detected on CT Images: From the Fleischner Society 2017; Radiology 2017; 284:228-243. 5. Mild limitations as above. Electronically Signed   By: Abigail Miyamoto M.D.   On: 02/17/2017 18:34   Dg Shoulder Left  Result Date: 02/17/2017 CLINICAL DATA:  Recent fall with known humeral and clavicular fractures EXAM: LEFT SHOULDER - 2+ VIEW COMPARISON:  None. FINDINGS: No dislocation of the proximal humerus is noted. Mildly downward displaced left clavicular fracture is noted in the mid to distal portion. No other focal abnormality is seen. IMPRESSION: Mid to distal left clavicular fracture. Electronically Signed   By: Inez Catalina M.D.   On: 02/17/2017 18:54   Dg Humerus Left  Result Date: 02/17/2017 CLINICAL DATA:  Recent fall with arm pain, initial encounter EXAM: LEFT HUMERUS - 2+ VIEW COMPARISON:  None. FINDINGS: Comminuted distal humeral fracture is noted as previously described with intra-articular extension. Impaction is noted at the fracture site. Considerable soft tissue swelling is noted. No proximal humeral fracture is seen. The underlying bony thorax is within normal limits. A minimally displaced mid to distal left clavicular fracture is seen. IMPRESSION: Distal left humeral fracture and mid to distal left clavicular fracture. Electronically Signed   By: Inez Catalina M.D.   On: 02/17/2017 18:53   Dg Hand Complete Right  Result Date: 02/17/2017 CLINICAL DATA:  Recent fall with right hand pain, initial encounter EXAM: RIGHT HAND - COMPLETE 3+ VIEW COMPARISON:  None. FINDINGS: The previously seen comminuted fracture of the distal right radius is again identified with intra-articular extension. Mild degenerative changes are noted in the interphalangeal joints. No other fracture or dislocation is seen. IMPRESSION: Comminuted distal right radial fracture Electronically Signed   By: Inez Catalina M.D.   On: 02/17/2017 18:47   Ct Maxillofacial Wo Contrast  Addendum Date: 02/17/2017   ADDENDUM REPORT: 02/17/2017 18:34 ADDENDUM: There is an acute appearing left transverse process fracture T1. Electronically Signed   By: Ashley Lori M.D.   On: 02/17/2017 18:34   Result Date: 02/17/2017 CLINICAL DATA:  Posttraumatic fall from attic today with loss of consciousness. EXAM: CT HEAD WITHOUT CONTRAST CT MAXILLOFACIAL WITHOUT CONTRAST CT CERVICAL SPINE WITHOUT CONTRAST TECHNIQUE: Multidetector CT imaging of the head, cervical spine, and maxillofacial structures were performed using the standard protocol without intravenous contrast. Multiplanar CT image reconstructions of the cervical spine and maxillofacial structures were also generated. COMPARISON:  MRI of the brain 08/02/2009 common cervical spine MRI 09/14/2008 FINDINGS: CT HEAD FINDINGS Brain: Mild superficial atrophy. No acute intracranial hemorrhage, midline shift or edema. No intra-axial mass nor extra-axial fluid collections. No hydrocephalus. No effacement of the basal cisterns or fourth ventricle. Minimal small vessel ischemic changes of periventricular white matter bilaterally. Vascular: No hyperdense vessels. Mild atherosclerosis of the carotid siphons right greater than left. Skull: No skull fracture or suspicious osseous lesions. Other: None CT MAXILLOFACIAL FINDINGS Osseous: No acute maxillofacial fracture. Orbits: Intact orbits and globes. No retrobulbar hematoma. Bilateral cataract extractions. Sinuses: Trace air-fluid level within the left sphenoid sinus with mild ethmoid and right maxillary sinus mucosal thickening. Soft tissues: Left premalar soft tissue contusion. CT CERVICAL SPINE FINDINGS Alignment: Slight straightening of cervical lordosis which may be due to positioning or muscle spasm. Intact atlantodental interval and craniocervical relationship. Skull base and vertebrae: No acute fracture. No primary bone lesion or focal  pathologic  process. Soft tissues and spinal canal: No prevertebral fluid or swelling. No visible canal hematoma. Disc levels: Moderate disc space narrowing at C6-7 with small posterior marginal osteophytes and uncovertebral joint osteoarthritis. Slight left-sided neural foraminal encroachment from uncovertebral joint spurring at C6-7. Upper chest: Negative. Other: None IMPRESSION: 1. Minimal small vessel ischemic disease of periventricular white matter. No acute intracranial abnormality. 2. Soft tissue contusion of the left cheek. No maxillofacial fracture. 3. 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 No acute cervical spine fracture or posttraumatic cervical subluxation. Electronically Signed: By: Ashley Lori M.D. On: 02/17/2017 18:27    Procedures Procedures (including critical care time)  Medications Ordered in ED Medications  acetaminophen (TYLENOL) tablet 650 mg (not administered)  oxyCODONE (Oxy IR/ROXICODONE) immediate release tablet 5 mg (not administered)  morphine 4 MG/ML injection 2-4 mg (not administered)  ondansetron (ZOFRAN-ODT) disintegrating tablet 4 mg (not administered)    Or  ondansetron (ZOFRAN) injection 4 mg (not administered)  metoprolol tartrate (LOPRESSOR) injection 5 mg (not administered)  iopamidol (ISOVUE-300) 61 % injection (100 mLs  Contrast Given 02/17/17 1728)  fentaNYL (SUBLIMAZE) injection 50 mcg (50 mcg Intravenous Given 02/17/17 1845)  ondansetron (ZOFRAN) injection 4 mg (4 mg Intravenous Given 02/17/17 2047)  sodium chloride 0.9 % bolus 1,000 mL (1,000 mLs Intravenous New Bag/Given 02/17/17 2122)     Initial Impression / Assessment and Plan / ED Course  I have reviewed the triage vital signs and the nursing notes.  Pertinent labs & imaging results that were available during my care of the patient were reviewed by me and considered in my medical decision making (see chart for details).     Lori Deleon is a 71 y.o.  female with a past medical history significant for hypertension, GERD, and osteopenia who presents with a fall.  Patient is accompanied by daughter-in-law who reports that patient was found on the ground on the concrete floor of the garage after a presumed fall from an attic through trapdoor.  Patient reports that she was in the attic and moved some boxes when they began to collapse on her.  Patient thinks that she fell out of the trap door and hit the ground in the garage from a normal height.  Patient does report loss of consciousness.  Patient was found to have dried epistaxis.  Patient is having pain in her neck mid and low back.  Patient also has pain in her left elbow and her right wrist.  Patient reports very mild headache.  Patient denies any chest pain, shortness of breath, nausea, diplopia, or vision changes.  She denies chest or abdominal pain.  She denies pain in her lower extreme knees or hips.  Patient's airway appears intact.  Breath sounds are equal bilaterally.  Initial blood pressure was not hypotensive.  Patient will have full trauma scans given the tenderness along her back which she was rolled.  There was tenderness in the neck and mid/low back.  Patient will have x-rays of the extremities where she has swelling and tenderness.  Patient had symmetric grip strength and 2+ radial pulses bilaterally.  Patient normal sensation in her hands.  Patient has swelling and deformity of the left proximal elbow and the right distal wrist.  Normal capillary refill bilaterally.  Patient had dried epistaxis in the left nare but no evidence of nasal septal hematoma.  Minimal face tenderness.  Patient will have Diagnostic imaging as well as trauma laboratory testing.  Patient will be given pain medicine during  initial workup.  Anticipate reassessment after workup.   Patient's diagnostic imaging revealed fractures of the right distal radius, left distal humerus, bilateral clavicles, and transverse process  of T1.  Patient also found to have elevated lactic acid.  Chest abdomen and pelvis imaging did not show intra-abdominal injury but there was some superficial bruising in the left abdomen.  Patient was tender in this location on reassessment.  CT of the head and neck showed no acute intracranial abnormality or injury.  No evidence of cervical spine injury.  Collar was removed.  Dr. Burney Gauze with hand orthopedics was called for the right wrist fracture.  He recommended splinting and follow-up in clinic.  Dr. Lorin Mercy with general orthopedics was called for the left elbow and clavicle fractures.  He recommended splinting and follow-up.  Given the isolated transverse process fracture do not feel patient requires consultation at this time however pain management will be important.  Splints were applied by orthopedic tech.  Hands were evaluated by me was normal sensation and full range of movement of the hands.  Normal capillary refill bilaterally.  The patient had a repeat lactic acid as the first 1 was elevated.  Unfortunately, it was trending upward.  Patient continued to have some nausea and left abdominal tenderness.  Given the patient's multiple injuries and the lactic acidosis, trauma will be called.  Trauma saw the patient and felt she required admission for further pain management, PT/OT evaluation, and further monitoring.  Patient will be admitted to trauma service.   Final Clinical Impressions(s) / ED Diagnoses   Final diagnoses:  Fall, initial encounter  Loss of consciousness ([place])  Closed fracture of transverse process of thoracic vertebra, initial encounter ([place])  Closed fracture of distal end of left humerus, unspecified fracture morphology, initial encounter  Other closed intra-articular fracture of distal end of right radius, initial encounter  Closed fracture of both clavicles, initial encounter  Lactic acidosis    Clinical Impression: 1. Fall, initial encounter   2. Pain   3. Loss  of consciousness ([place])   4. Closed fracture of transverse process of thoracic vertebra, initial encounter ([place])   5. Closed fracture of distal end of left humerus, unspecified fracture morphology, initial encounter   6. Other closed intra-articular fracture of distal end of right radius, initial encounter   7. Closed fracture of both clavicles, initial encounter   8. Lactic acidosis     Disposition: Admit to Trauma    Jeanise Durfey, Gwenyth Allegra, MD 02/18/17 [phone]

## 2017-02-17 NOTE — ED Triage Notes (Signed)
Pt arrives from home via [facility] reporting mechanical fall from attic with LOC of unknown duration.  Pt denies HA, SOB, CP. Neuro intact, pulses present.  Swelling noted to L shoulder and elbow. Resp e/u, c-collar on and aligned.

## 2017-02-17 NOTE — H&P (Signed)
Activation and Reason: consult, fall 1 story  Primary Survey: airway intact, breaths sounds present b/l, pulses intact  Lori Deleon is an 71 y.o. female.  HPI: 70 yo female in attic above garage was hit by storage box and lost balance and fell down on the garage floor. She is unsure if she lost consciousness but remembers not knowing how she fell and then piecing things back together. She estimates she was on the ground for 1 hour before her step daughter came back to the house to find her. She complains of bilateral arm pain and shoulder pain.  Past Medical History:  Diagnosis Date  . Arthritis   . CIN II (cervical intraepithelial neoplasia II)   . Esophageal reflux   . GERD (gastroesophageal reflux disease)   . Hyperglycemia   . Hypertension   . Intermittent chest pain   . Osteopenia   . UTI (lower urinary tract infection)     Past Surgical History:  Procedure Laterality Date  . BREAST BIOPSY Left 1987   neg    Family History  Problem Relation Age of Onset  . Breast cancer Neg Hx     Social History:  reports that she has never smoked. She has never used smokeless tobacco. She reports that she does not drink alcohol or use drugs.  Allergies:  Allergies  Allergen Reactions  . Sulfa Antibiotics Rash    Medications: I have reviewed the patient's current medications.  Results for orders placed or performed during the hospital encounter of 02/17/17 (from the past 48 hour(s))  Comprehensive metabolic panel     Status: Abnormal   Collection Time: 02/17/17  4:00 PM  Result Value Ref Range   Sodium 135 135 - 145 mmol/L   Potassium 3.5 3.5 - 5.1 mmol/L   Chloride 102 101 - 111 mmol/L   CO2 22 22 - 32 mmol/L   Glucose, Bld 162 (H) 65 - 99 mg/dL   BUN 24 (H) 6 - 20 mg/dL   Creatinine, Ser 0.98 0.44 - 1.00 mg/dL   Calcium 8.9 8.9 - 10.3 mg/dL   Total Protein 7.0 6.5 - 8.1 g/dL   Albumin 4.1 3.5 - 5.0 g/dL   AST 42 (H) 15 - 41 U/L   ALT 27 14 - 54 U/L   Alkaline Phosphatase 50 38 - 126 U/L   Total Bilirubin 1.1 0.3 - 1.2 mg/dL   GFR calc non Af Amer 56 (L) >60 mL/min   GFR calc Af Amer >60 >60 mL/min    Comment: (NOTE) The eGFR has been calculated using the CKD EPI equation. This calculation has not been validated in all clinical situations. eGFR's persistently <60 mL/min signify possible Chronic Kidney Disease.    Anion gap 11 5 - 15  CBC     Status: Abnormal   Collection Time: 02/17/17  4:00 PM  Result Value Ref Range   WBC 9.9 4.0 - 10.5 K/uL   RBC 4.39 3.87 - 5.11 MIL/uL   Hemoglobin 12.8 12.0 - 15.0 g/dL   HCT 37.9 36.0 - 46.0 %   MCV 86.3 78.0 - 100.0 fL   MCH 29.2 26.0 - 34.0 pg   MCHC 33.8 30.0 - 36.0 g/dL   RDW 15.7 (H) 11.5 - 15.5 %   Platelets 101 (L) 150 - 400 K/uL    Comment: PLATELET COUNT CONFIRMED BY SMEAR  Ethanol     Status: None   Collection Time: 02/17/17  4:00 PM  Result Value Ref Range  Alcohol, Ethyl (B) <10 <10 mg/dL    Comment:        LOWEST DETECTABLE LIMIT FOR SERUM ALCOHOL IS 10 mg/dL FOR MEDICAL PURPOSES ONLY   Protime-INR     Status: None   Collection Time: 02/17/17  4:00 PM  Result Value Ref Range   Prothrombin Time 14.6 11.4 - 15.2 seconds   INR 1.15   I-Stat Chem 8, ED     Status: Abnormal   Collection Time: 02/17/17  4:20 PM  Result Value Ref Range   Sodium 139 135 - 145 mmol/L   Potassium 4.0 3.5 - 5.1 mmol/L   Chloride 101 101 - 111 mmol/L   BUN 34 (H) 6 - 20 mg/dL   Creatinine, Ser 0.90 0.44 - 1.00 mg/dL   Glucose, Bld 162 (H) 65 - 99 mg/dL   Calcium, Ion 1.11 (L) 1.15 - 1.40 mmol/L   TCO2 25 22 - 32 mmol/L   Hemoglobin 14.6 12.0 - 15.0 g/dL   HCT 43.0 36.0 - 46.0 %  I-Stat CG4 Lactic Acid, ED     Status: Abnormal   Collection Time: 02/17/17  4:20 PM  Result Value Ref Range   Lactic Acid, Venous 3.77 (HH) 0.5 - 1.9 mmol/L   Comment NOTIFIED PHYSICIAN   Urinalysis, Routine w reflex microscopic     Status: Abnormal   Collection Time: 02/17/17  4:55 PM  Result Value Ref  Range   Color, Urine YELLOW YELLOW   APPearance CLEAR CLEAR   Specific Gravity, Urine 1.015 1.005 - 1.030   pH 6.0 5.0 - 8.0   Glucose, UA 50 (A) NEGATIVE mg/dL   Hgb urine dipstick NEGATIVE NEGATIVE   Bilirubin Urine NEGATIVE NEGATIVE   Ketones, ur NEGATIVE NEGATIVE mg/dL   Protein, ur NEGATIVE NEGATIVE mg/dL   Nitrite NEGATIVE NEGATIVE   Leukocytes, UA NEGATIVE NEGATIVE  I-Stat CG4 Lactic Acid, ED     Status: Abnormal   Collection Time: 02/17/17  8:56 PM  Result Value Ref Range   Lactic Acid, Venous 4.08 (HH) 0.5 - 1.9 mmol/L   Comment NOTIFIED PHYSICIAN     Dg Elbow 2 Views Left  Result Date: 02/17/2017 CLINICAL DATA:  Recent fall with left elbow pain, initial encounter EXAM: LEFT ELBOW - 2 VIEW COMPARISON:  None FINDINGS: Comminuted fracture of the distal left humerus is noted with impaction at the fracture site. Fracture lines extend into joint space. No dislocation is noted. Considerable soft tissue swelling is noted. IMPRESSION: Comminuted distal left humeral fracture with impaction at the fracture site in extension into the articular surface. Electronically Signed   By: Inez Catalina M.D.   On: 02/17/2017 18:46   Dg Wrist Complete Right  Result Date: 02/17/2017 CLINICAL DATA:  Recent fall with wrist and hand pain, initial encounter EXAM: RIGHT WRIST - COMPLETE 3+ VIEW COMPARISON:  None. FINDINGS: Comminuted distal radial fracture is noted with extension into the articular space in multiple areas. The carpal bones appear intact. Mild posterior angulation at the fracture site is noted. Soft tissue swelling is seen. IMPRESSION: Comminuted distal radial fracture with multiple fracture lines extending to the articular surface. Electronically Signed   By: Inez Catalina M.D.   On: 02/17/2017 18:45   Ct Head Wo Contrast  Addendum Date: 02/17/2017   ADDENDUM REPORT: 02/17/2017 18:34 ADDENDUM: There is an acute appearing left transverse process fracture T1. Electronically Signed   By:  Ashley Royalty M.D.   On: 02/17/2017 18:34   Result Date: 02/17/2017 CLINICAL DATA:  Posttraumatic fall from attic today with loss of consciousness. EXAM: CT HEAD WITHOUT CONTRAST CT MAXILLOFACIAL WITHOUT CONTRAST CT CERVICAL SPINE WITHOUT CONTRAST TECHNIQUE: Multidetector CT imaging of the head, cervical spine, and maxillofacial structures were performed using the standard protocol without intravenous contrast. Multiplanar CT image reconstructions of the cervical spine and maxillofacial structures were also generated. COMPARISON:  MRI of the brain 08/02/2009 common cervical spine MRI 09/14/2008 FINDINGS: CT HEAD FINDINGS Brain: Mild superficial atrophy. No acute intracranial hemorrhage, midline shift or edema. No intra-axial mass nor extra-axial fluid collections. No hydrocephalus. No effacement of the basal cisterns or fourth ventricle. Minimal small vessel ischemic changes of periventricular white matter bilaterally. Vascular: No hyperdense vessels. Mild atherosclerosis of the carotid siphons right greater than left. Skull: No skull fracture or suspicious osseous lesions. Other: None CT MAXILLOFACIAL FINDINGS Osseous: No acute maxillofacial fracture. Orbits: Intact orbits and globes. No retrobulbar hematoma. Bilateral cataract extractions. Sinuses: Trace air-fluid level within the left sphenoid sinus with mild ethmoid and right maxillary sinus mucosal thickening. Soft tissues: Left premalar soft tissue contusion. CT CERVICAL SPINE FINDINGS Alignment: Slight straightening of cervical lordosis which may be due to positioning or muscle spasm. Intact atlantodental interval and craniocervical relationship. Skull base and vertebrae: No acute fracture. No primary bone lesion or focal pathologic process. Soft tissues and spinal canal: No prevertebral fluid or swelling. No visible canal hematoma. Disc levels: Moderate disc space narrowing at C6-7 with small posterior marginal osteophytes and uncovertebral joint  osteoarthritis. Slight left-sided neural foraminal encroachment from uncovertebral joint spurring at C6-7. Upper chest: Negative. Other: None IMPRESSION: 1. Minimal small vessel ischemic disease of periventricular white matter. No acute intracranial abnormality. 2. Soft tissue contusion of the left cheek. No maxillofacial fracture. 3. 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 No acute cervical spine fracture or posttraumatic cervical subluxation. Electronically Signed: By: Ashley Royalty M.D. On: 02/17/2017 18:27   Ct Chest W Contrast  Result Date: 02/17/2017 CLINICAL DATA:  Fall.  Loss of consciousness.  Left arm pain. EXAM: CT CHEST, ABDOMEN, AND PELVIS WITH CONTRAST TECHNIQUE: Multidetector CT imaging of the chest, abdomen and pelvis was performed following the standard protocol during bolus administration of intravenous contrast. CONTRAST:  162m ISOVUE-300 IOPAMIDOL (ISOVUE-300) INJECTION 61% COMPARISON:  None. FINDINGS: CT CHEST FINDINGS Cardiovascular: Mild degradation secondary to extensive EKG wires and leads and resultant beam hardening artifact. Patient arm position also degraded exam. No evidence of aortic laceration or mediastinal hematoma. Tortuous thoracic aorta. Mild cardiomegaly, without pericardial effusion. Aortic atherosclerosis. Mediastinum/Nodes: No mediastinal or hilar adenopathy. Lungs/Pleura: No pleural fluid. No pneumothorax. 4 mm left upper lobe pulmonary nodule on image 41/ series 6. Minimal bibasilar subsegmental atelectasis dependently. Minimal posterior left upper lobe subpleural nodularity is likely due to a subpleural lymph node. No evidence of pulmonary contusion. Musculoskeletal: No chest wall hematoma. There is fluid, likely representing hemorrhage, within the low neck and supraclavicular regions bilaterally. Nondisplaced fracture involving the left transverse process of T1 on image 2/series 5. Mid right and distal left clavicular fractures. Both  clavicular fractures are minimally comminuted, but relatively mildly displaced. Left glenohumeral joint osteoarthritis. CT ABDOMEN PELVIS FINDINGS Hepatobiliary: Multifactorial degradation continuing into the upper abdomen. Normal liver. Normal gallbladder, without biliary ductal dilatation. Pancreas: Normal, without mass or ductal dilatation. Spleen: Normal in size, without focal abnormality. Adrenals/Urinary Tract: Normal adrenal glands. Scarred right kidney. Normal left kidney. Hyperattenuation within the collecting systems is favored to be due to early contrast excretion rather than collecting system calculi. Normal urinary bladder.  Stomach/Bowel: Normal stomach, without wall thickening. Large amount of stool within the sigmoid and rectum. Normal terminal ileum and appendix. Normal small bowel. No free intraperitoneal air. Vascular/Lymphatic: Aortic and branch vessel atherosclerosis. No abdominopelvic adenopathy. Reproductive: Probable hysterectomy versus uterine atrophy. No adnexal mass. Other: No significant free fluid. Musculoskeletal: Extensive bruising about the lateral left abdominal and upper pelvic subcutaneous tissues. No well-defined hematoma or evidence of active extravasation. Degenerative irregularity about the symphysis pubis. lumbosacral spondylosis. IMPRESSION: 1. Bilateral clavicular fractures and a left T1 transverse process fracture. 2. Extensive bruising about the left abdominal and upper pelvic walls. No intraabdominal/pelvic posttraumatic abnormality identified. 3.  Aortic Atherosclerosis (ICD10-I70.0). 4. 4 mm left upper lobe pulmonary nodule. No follow-up needed if patient is low-risk. Non-contrast chest CT can be considered in 12 months if patient is high-risk. This recommendation follows the consensus statement: Guidelines for Management of Incidental Pulmonary Nodules Detected on CT Images: From the Fleischner Society 2017; Radiology 2017; 284:228-243. 5. Mild limitations as above.  Electronically Signed   By: Abigail Miyamoto M.D.   On: 02/17/2017 18:34   Ct Cervical Spine Wo Contrast  Addendum Date: 02/17/2017   ADDENDUM REPORT: 02/17/2017 18:34 ADDENDUM: There is an acute appearing left transverse process fracture T1. Electronically Signed   By: Ashley Royalty M.D.   On: 02/17/2017 18:34   Result Date: 02/17/2017 CLINICAL DATA:  Posttraumatic fall from attic today with loss of consciousness. EXAM: CT HEAD WITHOUT CONTRAST CT MAXILLOFACIAL WITHOUT CONTRAST CT CERVICAL SPINE WITHOUT CONTRAST TECHNIQUE: Multidetector CT imaging of the head, cervical spine, and maxillofacial structures were performed using the standard protocol without intravenous contrast. Multiplanar CT image reconstructions of the cervical spine and maxillofacial structures were also generated. COMPARISON:  MRI of the brain 08/02/2009 common cervical spine MRI 09/14/2008 FINDINGS: CT HEAD FINDINGS Brain: Mild superficial atrophy. No acute intracranial hemorrhage, midline shift or edema. No intra-axial mass nor extra-axial fluid collections. No hydrocephalus. No effacement of the basal cisterns or fourth ventricle. Minimal small vessel ischemic changes of periventricular white matter bilaterally. Vascular: No hyperdense vessels. Mild atherosclerosis of the carotid siphons right greater than left. Skull: No skull fracture or suspicious osseous lesions. Other: None CT MAXILLOFACIAL FINDINGS Osseous: No acute maxillofacial fracture. Orbits: Intact orbits and globes. No retrobulbar hematoma. Bilateral cataract extractions. Sinuses: Trace air-fluid level within the left sphenoid sinus with mild ethmoid and right maxillary sinus mucosal thickening. Soft tissues: Left premalar soft tissue contusion. CT CERVICAL SPINE FINDINGS Alignment: Slight straightening of cervical lordosis which may be due to positioning or muscle spasm. Intact atlantodental interval and craniocervical relationship. Skull base and vertebrae: No acute fracture.  No primary bone lesion or focal pathologic process. Soft tissues and spinal canal: No prevertebral fluid or swelling. No visible canal hematoma. Disc levels: Moderate disc space narrowing at C6-7 with small posterior marginal osteophytes and uncovertebral joint osteoarthritis. Slight left-sided neural foraminal encroachment from uncovertebral joint spurring at C6-7. Upper chest: Negative. Other: None IMPRESSION: 1. Minimal small vessel ischemic disease of periventricular white matter. No acute intracranial abnormality. 2. Soft tissue contusion of the left cheek. No maxillofacial fracture. 3. 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 No acute cervical spine fracture or posttraumatic cervical subluxation. Electronically Signed: By: Ashley Royalty M.D. On: 02/17/2017 18:27   Ct Abdomen Pelvis W Contrast  Result Date: 02/17/2017 CLINICAL DATA:  Fall.  Loss of consciousness.  Left arm pain. EXAM: CT CHEST, ABDOMEN, AND PELVIS WITH CONTRAST TECHNIQUE: Multidetector CT imaging of the chest, abdomen and pelvis  was performed following the standard protocol during bolus administration of intravenous contrast. CONTRAST:  130m ISOVUE-300 IOPAMIDOL (ISOVUE-300) INJECTION 61% COMPARISON:  None. FINDINGS: CT CHEST FINDINGS Cardiovascular: Mild degradation secondary to extensive EKG wires and leads and resultant beam hardening artifact. Patient arm position also degraded exam. No evidence of aortic laceration or mediastinal hematoma. Tortuous thoracic aorta. Mild cardiomegaly, without pericardial effusion. Aortic atherosclerosis. Mediastinum/Nodes: No mediastinal or hilar adenopathy. Lungs/Pleura: No pleural fluid. No pneumothorax. 4 mm left upper lobe pulmonary nodule on image 41/ series 6. Minimal bibasilar subsegmental atelectasis dependently. Minimal posterior left upper lobe subpleural nodularity is likely due to a subpleural lymph node. No evidence of pulmonary contusion. Musculoskeletal: No  chest wall hematoma. There is fluid, likely representing hemorrhage, within the low neck and supraclavicular regions bilaterally. Nondisplaced fracture involving the left transverse process of T1 on image 2/series 5. Mid right and distal left clavicular fractures. Both clavicular fractures are minimally comminuted, but relatively mildly displaced. Left glenohumeral joint osteoarthritis. CT ABDOMEN PELVIS FINDINGS Hepatobiliary: Multifactorial degradation continuing into the upper abdomen. Normal liver. Normal gallbladder, without biliary ductal dilatation. Pancreas: Normal, without mass or ductal dilatation. Spleen: Normal in size, without focal abnormality. Adrenals/Urinary Tract: Normal adrenal glands. Scarred right kidney. Normal left kidney. Hyperattenuation within the collecting systems is favored to be due to early contrast excretion rather than collecting system calculi. Normal urinary bladder. Stomach/Bowel: Normal stomach, without wall thickening. Large amount of stool within the sigmoid and rectum. Normal terminal ileum and appendix. Normal small bowel. No free intraperitoneal air. Vascular/Lymphatic: Aortic and branch vessel atherosclerosis. No abdominopelvic adenopathy. Reproductive: Probable hysterectomy versus uterine atrophy. No adnexal mass. Other: No significant free fluid. Musculoskeletal: Extensive bruising about the lateral left abdominal and upper pelvic subcutaneous tissues. No well-defined hematoma or evidence of active extravasation. Degenerative irregularity about the symphysis pubis. lumbosacral spondylosis. IMPRESSION: 1. Bilateral clavicular fractures and a left T1 transverse process fracture. 2. Extensive bruising about the left abdominal and upper pelvic walls. No intraabdominal/pelvic posttraumatic abnormality identified. 3.  Aortic Atherosclerosis (ICD10-I70.0). 4. 4 mm left upper lobe pulmonary nodule. No follow-up needed if patient is low-risk. Non-contrast chest CT can be  considered in 12 months if patient is high-risk. This recommendation follows the consensus statement: Guidelines for Management of Incidental Pulmonary Nodules Detected on CT Images: From the Fleischner Society 2017; Radiology 2017; 284:228-243. 5. Mild limitations as above. Electronically Signed   By: KAbigail MiyamotoM.D.   On: 02/17/2017 18:34   Dg Shoulder Left  Result Date: 02/17/2017 CLINICAL DATA:  Recent fall with known humeral and clavicular fractures EXAM: LEFT SHOULDER - 2+ VIEW COMPARISON:  None. FINDINGS: No dislocation of the proximal humerus is noted. Mildly downward displaced left clavicular fracture is noted in the mid to distal portion. No other focal abnormality is seen. IMPRESSION: Mid to distal left clavicular fracture. Electronically Signed   By: MInez CatalinaM.D.   On: 02/17/2017 18:54   Dg Humerus Left  Result Date: 02/17/2017 CLINICAL DATA:  Recent fall with arm pain, initial encounter EXAM: LEFT HUMERUS - 2+ VIEW COMPARISON:  None. FINDINGS: Comminuted distal humeral fracture is noted as previously described with intra-articular extension. Impaction is noted at the fracture site. Considerable soft tissue swelling is noted. No proximal humeral fracture is seen. The underlying bony thorax is within normal limits. A minimally displaced mid to distal left clavicular fracture is seen. IMPRESSION: Distal left humeral fracture and mid to distal left clavicular fracture. Electronically Signed   By: MLinus MakoD.  On: 02/17/2017 18:53   Dg Hand Complete Right  Result Date: 02/17/2017 CLINICAL DATA:  Recent fall with right hand pain, initial encounter EXAM: RIGHT HAND - COMPLETE 3+ VIEW COMPARISON:  None. FINDINGS: The previously seen comminuted fracture of the distal right radius is again identified with intra-articular extension. Mild degenerative changes are noted in the interphalangeal joints. No other fracture or dislocation is seen. IMPRESSION: Comminuted distal right radial  fracture Electronically Signed   By: Inez Catalina M.D.   On: 02/17/2017 18:47   Ct Maxillofacial Wo Contrast  Addendum Date: 02/17/2017   ADDENDUM REPORT: 02/17/2017 18:34 ADDENDUM: There is an acute appearing left transverse process fracture T1. Electronically Signed   By: Ashley Royalty M.D.   On: 02/17/2017 18:34   Result Date: 02/17/2017 CLINICAL DATA:  Posttraumatic fall from attic today with loss of consciousness. EXAM: CT HEAD WITHOUT CONTRAST CT MAXILLOFACIAL WITHOUT CONTRAST CT CERVICAL SPINE WITHOUT CONTRAST TECHNIQUE: Multidetector CT imaging of the head, cervical spine, and maxillofacial structures were performed using the standard protocol without intravenous contrast. Multiplanar CT image reconstructions of the cervical spine and maxillofacial structures were also generated. COMPARISON:  MRI of the brain 08/02/2009 common cervical spine MRI 09/14/2008 FINDINGS: CT HEAD FINDINGS Brain: Mild superficial atrophy. No acute intracranial hemorrhage, midline shift or edema. No intra-axial mass nor extra-axial fluid collections. No hydrocephalus. No effacement of the basal cisterns or fourth ventricle. Minimal small vessel ischemic changes of periventricular white matter bilaterally. Vascular: No hyperdense vessels. Mild atherosclerosis of the carotid siphons right greater than left. Skull: No skull fracture or suspicious osseous lesions. Other: None CT MAXILLOFACIAL FINDINGS Osseous: No acute maxillofacial fracture. Orbits: Intact orbits and globes. No retrobulbar hematoma. Bilateral cataract extractions. Sinuses: Trace air-fluid level within the left sphenoid sinus with mild ethmoid and right maxillary sinus mucosal thickening. Soft tissues: Left premalar soft tissue contusion. CT CERVICAL SPINE FINDINGS Alignment: Slight straightening of cervical lordosis which may be due to positioning or muscle spasm. Intact atlantodental interval and craniocervical relationship. Skull base and vertebrae: No acute  fracture. No primary bone lesion or focal pathologic process. Soft tissues and spinal canal: No prevertebral fluid or swelling. No visible canal hematoma. Disc levels: Moderate disc space narrowing at C6-7 with small posterior marginal osteophytes and uncovertebral joint osteoarthritis. Slight left-sided neural foraminal encroachment from uncovertebral joint spurring at C6-7. Upper chest: Negative. Other: None IMPRESSION: 1. Minimal small vessel ischemic disease of periventricular white matter. No acute intracranial abnormality. 2. Soft tissue contusion of the left cheek. No maxillofacial fracture. 3. 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 No acute cervical spine fracture or posttraumatic cervical subluxation. Electronically Signed: By: Ashley Royalty M.D. On: 02/17/2017 18:27    Review of Systems  Constitutional: Negative for chills and fever.  HENT: Negative for hearing loss.   Eyes: Negative for blurred vision and double vision.  Respiratory: Negative for cough and hemoptysis.   Cardiovascular: Negative for chest pain and palpitations.  Gastrointestinal: Negative for abdominal pain, nausea and vomiting.  Genitourinary: Negative for dysuria and urgency.  Musculoskeletal: Negative for myalgias and neck pain.  Skin: Negative for itching and rash.  Neurological: Negative for dizziness, tingling and headaches.  Endo/Heme/Allergies: Does not bruise/bleed easily.  Psychiatric/Behavioral: Negative for depression and suicidal ideas.   Blood pressure 130/76, pulse 69, temperature (!) 97.5 F (36.4 C), temperature source Oral, resp. rate 15, height _0  (1.626 m), weight 69.4 kg (153 lb), SpO2 95 %. Physical Exam  Vitals reviewed. Constitutional: She is oriented  to person, place, and time. She appears well-developed and well-nourished.  HENT:  Head: Normocephalic and atraumatic.  Eyes: Pupils are equal, round, and reactive to light. Conjunctivae and EOM are normal.    Neck: Normal range of motion. Neck supple.  Cardiovascular: Normal rate and regular rhythm.   Respiratory: Effort normal and breath sounds normal.  GI: Soft. Bowel sounds are normal. She exhibits no distension. There is no tenderness.  Musculoskeletal:  Right wrist in splint, left arm in splint, movement limited by pain  Neurological: She is alert and oriented to person, place, and time.  Skin: Skin is warm and dry.  Psychiatric: She has a normal mood and affect. Her behavior is normal.      Assessment/Plan: 71 yo female with fall 1 story with right distal radial fracture, left distal humerus fracture, b/l clavicle fractures, elevated lactate after 1 l fluid -continue fluid resuscitation -admit to trauma -f/u ortho recs -pain control  Procedures: none  Arta Bruce Kinsinger 02/17/2017, 9:57 PM

## 2017-02-17 NOTE — ED Notes (Signed)
Delay in lab draw,   Tech assisting pt on bedpan.

## 2017-02-17 NOTE — ED Notes (Signed)
Placed patient on the bedpan patient is resting with family at beside and call bell in reach

## 2017-02-18 ENCOUNTER — Inpatient Hospital Stay (HOSPITAL_COMMUNITY): Payer: Medicare HMO

## 2017-02-18 LAB — BASIC METABOLIC PANEL
Anion gap: 9 (ref 5–15)
BUN: 20 mg/dL (ref 6–20)
CHLORIDE: 105 mmol/L (ref 101–111)
CO2: 19 mmol/L — AB (ref 22–32)
Calcium: 8.3 mg/dL — ABNORMAL LOW (ref 8.9–10.3)
Creatinine, Ser: 0.84 mg/dL (ref 0.44–1.00)
GFR calc non Af Amer: >60 mL/min (ref >60)
Glucose, Bld: 143 mg/dL — ABNORMAL HIGH (ref 65–99)
Potassium: 3.9 mmol/L (ref 3.5–5.1)
SODIUM: 133 mmol/L — AB (ref 135–145)

## 2017-02-18 LAB — CBC
HEMATOCRIT: 33.7 % — AB (ref 36.0–46.0)
HEMOGLOBIN: 11.5 g/dL — AB (ref 12.0–15.0)
MCH: 29 pg (ref 26.0–34.0)
MCHC: 33.5 g/dL (ref 30.0–36.0)
MCV: 86.6 fL (ref 78.0–100.0)
Platelets: 87 10*3/uL — ABNORMAL LOW (ref 150–400)
RBC: 3.89 MIL/uL (ref 3.87–5.11)
RDW: 15.9 % — ABNORMAL HIGH (ref 11.5–15.5)
WBC: 7.5 10*3/uL (ref 4.0–10.5)

## 2017-02-18 LAB — LACTIC ACID, PLASMA: Lactic Acid, Venous: 2.4 mmol/L (ref 0.5–1.9)

## 2017-02-18 MED ORDER — SODIUM CHLORIDE 0.9 % IV SOLN
INTRAVENOUS | Status: DC
  Administered 2017-02-18: 15:00:00 via INTRAVENOUS

## 2017-02-18 NOTE — Evaluation (Signed)
Occupational Therapy Evaluation Patient Details Name: Lori Deleon MRN: [id number] DOB: 08/16/1945 Today's Date: 02/18/2017    History of Present Illness 71 yo female in attic above garage was hit by storage box and lost balance and fell down on the garage floor. Pt presenting with right distal radial fracture, left distal humerus fracture, and bilateral clavicle fractures. PMH including HTN, GERD, and arthritis.    Clinical Impression   PTA, pt was living with her son and was independent. Pt requiring Max A for ADLs and Min Guard for funcitonal mobility. Provided education on edema management, ROM for digits, sling management, and UB dressing compensatory techniques. Pt requires further acute OT to increase safety and independence with ADLs and education family on safe ADL compensatory techniques. Recommend dc home with 24 hour assistance, HH Aide, and HHOT to optimize safety for ADLs in home.      Follow Up Recommendations  Home health OT;Supervision/Assistance - 24 hour [facility])    Equipment Recommendations  3 in 1 bedside commode    Recommendations for Other Services PT consult     Precautions / Restrictions Precautions Precautions: Fall;Other (comment) (bilateral clavicle fxs with shoudler immobilizers) Restrictions Weight Bearing Restrictions: Yes RUE Weight Bearing: Non weight bearing (No formal order. assume NWB) LUE Weight Bearing: Non weight bearing (No formal order. assume NWB) Other Position/Activity Restrictions: No formal order. assume NWB. Limited ROM to digits only until further clarification.      Mobility Bed Mobility Overal bed mobility: Needs Assistance Bed Mobility: Supine to Sit;Sit to Supine     Supine to sit: Min assist Sit to supine: Min assist   General bed mobility comments: Min A to elevate trunk into sitting and Min A to manage LEs back into bed  Transfers Overall transfer level: Needs assistance Equipment used: None Transfers: Sit  to/from Stand Sit to Stand: Min guard         General transfer comment: Min Guard for safety    Balance                                           ADL either performed or assessed with clinical judgement   ADL Overall ADL's : Needs assistance/impaired Eating/Feeding: Maximal assistance;Sitting Eating/Feeding Details (indicate cue type and reason): unable to self feed due to immobilization of BUEs Grooming: Maximal assistance;Standing   Upper Body Bathing: Maximal assistance;Sitting   Lower Body Bathing: Maximal assistance;Sit to/from stand   Upper Body Dressing : Maximal assistance;Sitting Upper Body Dressing Details (indicate cue type and reason): Provided educaiton for appropiate UB clothing to increase independence. Discussed UB dressing techniques. Will need practice. Educated daughter in law on proper sling positioning Lower Body Dressing: Maximal assistance;Sit to/from stand   Toilet Transfer: Min guard;Ambulation;Regular Toilet   Toileting- Clothing Manipulation and Hygiene: Maximal assistance;Sit to/from stand Toileting - Clothing Manipulation Details (indicate cue type and reason): Able to maintain standing balance but unabel to perform toilet hygiene due to BUE immobilization   Tub/Shower Transfer Details (indicate cue type and reason): Discussed use of 3N1 for tub seat Functional mobility during ADLs: Min guard General ADL Comments: Pt demonstrating decreased fucntional performance and requires Max A for all ADLs. Pt requiring Min Guard A for functional mobility and toilet transfer. educated pt and family on edema management and finger ROM     Vision Patient Visual Report: No change from baseline  Perception     Praxis      Pertinent Vitals/Pain Pain Assessment: Faces Faces Pain Scale: Hurts little more Pain Location: RUE and LUE Pain Descriptors / Indicators: Constant;Discomfort;Grimacing Pain Intervention(s): Monitored during  session;Repositioned     Hand Dominance Right   Extremity/Trunk Assessment Upper Extremity Assessment Upper Extremity Assessment: RUE deficits/detail;LUE deficits/detail RUE Deficits / Details: right distal radial fracture and clavicle fx. Pt with weakness in R hand grasp and unable to perform opposition from thumb to 5th digit. Able to perform opposition to 1st-4th digits RUE: Unable to fully assess due to immobilization RUE Coordination: decreased fine motor;decreased gross motor LUE Deficits / Details:  left distal humerus fracture and clavicle fx. WFL grasp strength and finger opposition LUE: Unable to fully assess due to immobilization LUE Coordination: decreased fine motor;decreased gross motor   Lower Extremity Assessment Lower Extremity Assessment: Overall WFL for tasks assessed   Cervical / Trunk Assessment Cervical / Trunk Assessment: Normal   Communication Communication Communication: No difficulties   Cognition Arousal/Alertness: Awake/alert Behavior During Therapy: WFL for tasks assessed/performed Overall Cognitive Status: Within Functional Limits for tasks assessed                                     General Comments  Daughter in law present throughout session. Pt becoming hot and nauseous after mobility    Exercises Exercises: Hand exercises Hand Exercises Digit Composite Flexion: AROM;Both;10 reps;Supine Composite Extension: AROM;Both;10 reps;Seated Opposition: AROM;Both;10 reps;Supine (R hand unable to oppose thumb to 5th digit)   Shoulder Instructions      Home Living Family/patient expects to be discharged to:: Private residence Living Arrangements: Children (son, daughter in law, and grandchildren) Available Help at Discharge: Family;Available PRN/intermittently (short term 24 hour) Type of Home: House Home Access: Stairs to enter [employer] of Steps: 6 Entrance Stairs-Rails: Right;Left Home Layout: Two level Alternate  Level Stairs-Number of Steps: flight Alternate Level Stairs-Rails: Right;Left Bathroom Shower/Tub: Tub/shower unit;Curtain;Walk-in shower (pt uses tub)   Bathroom Toilet: Standard     Home Equipment: None;Shower seat - built in (shower seat in walk in only)          Prior Functioning/Environment Level of Independence: Independent        Comments: ADLs, IADLs, driving, and enjoys puzzles        OT Problem List: Decreased strength;Decreased range of motion;Decreased activity tolerance;Impaired balance (sitting and/or standing);Decreased safety awareness;Decreased knowledge of use of DME or AE;Decreased knowledge of precautions;Impaired UE functional use;Pain      OT Treatment/Interventions: Self-care/ADL training;Therapeutic exercise;Energy conservation;DME and/or AE instruction;Therapeutic activities;Patient/family education    OT Goals(Current goals can be found in the care plan section) Acute Rehab OT Goals Patient Stated Goal: return to PLOF OT Goal Formulation: With patient Time For Goal Achievement: 03/04/17 Potential to Achieve Goals: Good ADL Goals Pt Will Perform Upper Body Dressing: with mod assist;with caregiver independent in assisting;sitting Pt Will Perform Lower Body Dressing: with mod assist;with caregiver independent in assisting;sit to/from stand Pt Will Transfer to Toilet: with modified independence;regular height toilet;ambulating Pt Will Perform Tub/Shower Transfer: Tub transfer;ambulating;3 in 1;with min assist;with caregiver independent in assisting Additional ADL Goal #1: Pt will verbalize three edema management techniques  OT Frequency: Min 3X/week   Barriers to D/C:            Co-evaluation              AM-PAC PT "6 Clicks"  Daily Activity     Outcome Measure Help from another person eating meals?: A Lot Help from another person taking care of personal grooming?: A Lot Help from another person toileting, which includes using toliet,  bedpan, or urinal?: A Lot Help from another person bathing (including washing, rinsing, drying)?: A Lot Help from another person to put on and taking off regular upper body clothing?: A Lot Help from another person to put on and taking off regular lower body clothing?: A Lot 6 Click Score: 12   End of Session Equipment Utilized During Treatment: Other (comment) (bil shoulder immobilizers) Nurse Communication: Mobility status;Other (comment) (IV beeping)  Activity Tolerance: Patient tolerated treatment well Patient left: in bed;with call bell/phone within reach;with family/visitor present  OT Visit Diagnosis: Unsteadiness on feet (R26.81);Other abnormalities of gait and mobility (R26.89);Muscle weakness (generalized) (M62.81);Pain Pain - Right/Left:  (Bilateral) Pain - part of body: Shoulder;Hand;Arm                Time: 2751-7001 OT Time Calculation (min): 37 min Charges:  OT General Charges $OT Visit: 1 Visit OT Evaluation $OT Eval Moderate Complexity: 1 Mod OT Treatments $Self Care/Home Management : 8-22 mins G-Codes:     [place], OTR/L Acute Rehab Pager: [phone] Office: [place] 02/18/2017, 5:39 PM

## 2017-02-18 NOTE — Progress Notes (Signed)
Pt requesting daily meds, (no orders.)  Pt also needs weight bearing status and ROM orders.  Initial note placed for MD with no result.  Text page sent for above.  Awaiting return call.  [id number]

## 2017-02-18 NOTE — Progress Notes (Signed)
Subjective/Chief Complaint: Denies chest pain or SOB Reporting new left foot pain   Objective: Vital signs in last 24 hours: Temp:  [97.5 F (36.4 C)-98.4 F (36.9 C)] 98.4 F (36.9 C) (10/21 0635) Pulse Rate:  [65-90] 86 (10/21 0635) Resp:  [13-30] 16 (10/21 0635) BP: (101-156)/(48-87) 131/48 (10/21 0635) SpO2:  [95 %-100 %] 100 % (10/21 0635) Weight:  [69.4 kg (153 lb)] 69.4 kg (153 lb) (10/20 1455) Last BM Date: 02/17/17  Intake/Output from previous day: 10/20 0701 - 10/21 0700 In: 237 [P.O.:237] Out: -  Intake/Output this shift: Total I/O In: 360 [P.O.:360] Out: 201 [Urine:200; Stool:1]  Exam: Awake and alert Arm in sling, wrist in splint Lungs clear CV RRR Abdomen soft, NT Left foot tender without swelling  Lab Results:   Recent Labs  02/17/17 1600 02/17/17 1620 02/18/17 0056  WBC 9.9  --  7.5  HGB 12.8 14.6 11.5*  HCT 37.9 43.0 33.7*  PLT 101*  --  87*   BMET  Recent Labs  02/17/17 1600 02/17/17 1620 02/18/17 0056  NA 135 139 133*  K 3.5 4.0 3.9  CL 102 101 105  CO2 22  --  19*  GLUCOSE 162* 162* 143*  BUN 24* 34* 20  CREATININE 0.98 0.90 0.84  CALCIUM 8.9  --  8.3*   PT/INR  Recent Labs  02/17/17 1600  LABPROT 14.6  INR 1.15   ABG No results for input(s): PHART, HCO3 in the last 72 hours.  Invalid input(s): PCO2, PO2  Studies/Results: Dg Elbow 2 Views Left  Result Date: 02/17/2017 CLINICAL DATA:  Recent fall with left elbow pain, initial encounter EXAM: LEFT ELBOW - 2 VIEW COMPARISON:  None FINDINGS: Comminuted fracture of the distal left humerus is noted with impaction at the fracture site. Fracture lines extend into joint space. No dislocation is noted. Considerable soft tissue swelling is noted. IMPRESSION: Comminuted distal left humeral fracture with impaction at the fracture site in extension into the articular surface. Electronically Signed   By: Inez Catalina M.D.   On: 02/17/2017 18:46   Dg Wrist Complete  Right  Result Date: 02/17/2017 CLINICAL DATA:  Recent fall with wrist and hand pain, initial encounter EXAM: RIGHT WRIST - COMPLETE 3+ VIEW COMPARISON:  None. FINDINGS: Comminuted distal radial fracture is noted with extension into the articular space in multiple areas. The carpal bones appear intact. Mild posterior angulation at the fracture site is noted. Soft tissue swelling is seen. IMPRESSION: Comminuted distal radial fracture with multiple fracture lines extending to the articular surface. Electronically Signed   By: Inez Catalina M.D.   On: 02/17/2017 18:45   Ct Head Wo Contrast  Addendum Date: 02/17/2017   ADDENDUM REPORT: 02/17/2017 18:34 ADDENDUM: There is an acute appearing left transverse process fracture T1. Electronically Signed   By: Ashley Royalty M.D.   On: 02/17/2017 18:34   Result Date: 02/17/2017 CLINICAL DATA:  Posttraumatic fall from attic today with loss of consciousness. EXAM: CT HEAD WITHOUT CONTRAST CT MAXILLOFACIAL WITHOUT CONTRAST CT CERVICAL SPINE WITHOUT CONTRAST TECHNIQUE: Multidetector CT imaging of the head, cervical spine, and maxillofacial structures were performed using the standard protocol without intravenous contrast. Multiplanar CT image reconstructions of the cervical spine and maxillofacial structures were also generated. COMPARISON:  MRI of the brain 08/02/2009 common cervical spine MRI 09/14/2008 FINDINGS: CT HEAD FINDINGS Brain: Mild superficial atrophy. No acute intracranial hemorrhage, midline shift or edema. No intra-axial mass nor extra-axial fluid collections. No hydrocephalus. No effacement of the basal  cisterns or fourth ventricle. Minimal small vessel ischemic changes of periventricular white matter bilaterally. Vascular: No hyperdense vessels. Mild atherosclerosis of the carotid siphons right greater than left. Skull: No skull fracture or suspicious osseous lesions. Other: None CT MAXILLOFACIAL FINDINGS Osseous: No acute maxillofacial fracture. Orbits:  Intact orbits and globes. No retrobulbar hematoma. Bilateral cataract extractions. Sinuses: Trace air-fluid level within the left sphenoid sinus with mild ethmoid and right maxillary sinus mucosal thickening. Soft tissues: Left premalar soft tissue contusion. CT CERVICAL SPINE FINDINGS Alignment: Slight straightening of cervical lordosis which may be due to positioning or muscle spasm. Intact atlantodental interval and craniocervical relationship. Skull base and vertebrae: No acute fracture. No primary bone lesion or focal pathologic process. Soft tissues and spinal canal: No prevertebral fluid or swelling. No visible canal hematoma. Disc levels: Moderate disc space narrowing at C6-7 with small posterior marginal osteophytes and uncovertebral joint osteoarthritis. Slight left-sided neural foraminal encroachment from uncovertebral joint spurring at C6-7. Upper chest: Negative. Other: None IMPRESSION: 1. Minimal small vessel ischemic disease of periventricular white matter. No acute intracranial abnormality. 2. Soft tissue contusion of the left cheek. No maxillofacial fracture. 3. 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 No acute cervical spine fracture or posttraumatic cervical subluxation. Electronically Signed: By: Ashley Royalty M.D. On: 02/17/2017 18:27   Ct Chest W Contrast  Result Date: 02/17/2017 CLINICAL DATA:  Fall.  Loss of consciousness.  Left arm pain. EXAM: CT CHEST, ABDOMEN, AND PELVIS WITH CONTRAST TECHNIQUE: Multidetector CT imaging of the chest, abdomen and pelvis was performed following the standard protocol during bolus administration of intravenous contrast. CONTRAST:  114mL ISOVUE-300 IOPAMIDOL (ISOVUE-300) INJECTION 61% COMPARISON:  None. FINDINGS: CT CHEST FINDINGS Cardiovascular: Mild degradation secondary to extensive EKG wires and leads and resultant beam hardening artifact. Patient arm position also degraded exam. No evidence of aortic laceration or  mediastinal hematoma. Tortuous thoracic aorta. Mild cardiomegaly, without pericardial effusion. Aortic atherosclerosis. Mediastinum/Nodes: No mediastinal or hilar adenopathy. Lungs/Pleura: No pleural fluid. No pneumothorax. 4 mm left upper lobe pulmonary nodule on image 41/ series 6. Minimal bibasilar subsegmental atelectasis dependently. Minimal posterior left upper lobe subpleural nodularity is likely due to a subpleural lymph node. No evidence of pulmonary contusion. Musculoskeletal: No chest wall hematoma. There is fluid, likely representing hemorrhage, within the low neck and supraclavicular regions bilaterally. Nondisplaced fracture involving the left transverse process of T1 on image 2/series 5. Mid right and distal left clavicular fractures. Both clavicular fractures are minimally comminuted, but relatively mildly displaced. Left glenohumeral joint osteoarthritis. CT ABDOMEN PELVIS FINDINGS Hepatobiliary: Multifactorial degradation continuing into the upper abdomen. Normal liver. Normal gallbladder, without biliary ductal dilatation. Pancreas: Normal, without mass or ductal dilatation. Spleen: Normal in size, without focal abnormality. Adrenals/Urinary Tract: Normal adrenal glands. Scarred right kidney. Normal left kidney. Hyperattenuation within the collecting systems is favored to be due to early contrast excretion rather than collecting system calculi. Normal urinary bladder. Stomach/Bowel: Normal stomach, without wall thickening. Large amount of stool within the sigmoid and rectum. Normal terminal ileum and appendix. Normal small bowel. No free intraperitoneal air. Vascular/Lymphatic: Aortic and branch vessel atherosclerosis. No abdominopelvic adenopathy. Reproductive: Probable hysterectomy versus uterine atrophy. No adnexal mass. Other: No significant free fluid. Musculoskeletal: Extensive bruising about the lateral left abdominal and upper pelvic subcutaneous tissues. No well-defined hematoma or  evidence of active extravasation. Degenerative irregularity about the symphysis pubis. lumbosacral spondylosis. IMPRESSION: 1. Bilateral clavicular fractures and a left T1 transverse process fracture. 2. Extensive bruising about the left abdominal and  upper pelvic walls. No intraabdominal/pelvic posttraumatic abnormality identified. 3.  Aortic Atherosclerosis (ICD10-I70.0). 4. 4 mm left upper lobe pulmonary nodule. No follow-up needed if patient is low-risk. Non-contrast chest CT can be considered in 12 months if patient is high-risk. This recommendation follows the consensus statement: Guidelines for Management of Incidental Pulmonary Nodules Detected on CT Images: From the Fleischner Society 2017; Radiology 2017; 284:228-243. 5. Mild limitations as above. Electronically Signed   By: Abigail Miyamoto M.D.   On: 02/17/2017 18:34   Ct Cervical Spine Wo Contrast  Addendum Date: 02/17/2017   ADDENDUM REPORT: 02/17/2017 18:34 ADDENDUM: There is an acute appearing left transverse process fracture T1. Electronically Signed   By: Ashley Royalty M.D.   On: 02/17/2017 18:34   Result Date: 02/17/2017 CLINICAL DATA:  Posttraumatic fall from attic today with loss of consciousness. EXAM: CT HEAD WITHOUT CONTRAST CT MAXILLOFACIAL WITHOUT CONTRAST CT CERVICAL SPINE WITHOUT CONTRAST TECHNIQUE: Multidetector CT imaging of the head, cervical spine, and maxillofacial structures were performed using the standard protocol without intravenous contrast. Multiplanar CT image reconstructions of the cervical spine and maxillofacial structures were also generated. COMPARISON:  MRI of the brain 08/02/2009 common cervical spine MRI 09/14/2008 FINDINGS: CT HEAD FINDINGS Brain: Mild superficial atrophy. No acute intracranial hemorrhage, midline shift or edema. No intra-axial mass nor extra-axial fluid collections. No hydrocephalus. No effacement of the basal cisterns or fourth ventricle. Minimal small vessel ischemic changes of periventricular  white matter bilaterally. Vascular: No hyperdense vessels. Mild atherosclerosis of the carotid siphons right greater than left. Skull: No skull fracture or suspicious osseous lesions. Other: None CT MAXILLOFACIAL FINDINGS Osseous: No acute maxillofacial fracture. Orbits: Intact orbits and globes. No retrobulbar hematoma. Bilateral cataract extractions. Sinuses: Trace air-fluid level within the left sphenoid sinus with mild ethmoid and right maxillary sinus mucosal thickening. Soft tissues: Left premalar soft tissue contusion. CT CERVICAL SPINE FINDINGS Alignment: Slight straightening of cervical lordosis which may be due to positioning or muscle spasm. Intact atlantodental interval and craniocervical relationship. Skull base and vertebrae: No acute fracture. No primary bone lesion or focal pathologic process. Soft tissues and spinal canal: No prevertebral fluid or swelling. No visible canal hematoma. Disc levels: Moderate disc space narrowing at C6-7 with small posterior marginal osteophytes and uncovertebral joint osteoarthritis. Slight left-sided neural foraminal encroachment from uncovertebral joint spurring at C6-7. Upper chest: Negative. Other: None IMPRESSION: 1. Minimal small vessel ischemic disease of periventricular white matter. No acute intracranial abnormality. 2. Soft tissue contusion of the left cheek. No maxillofacial fracture. 3. 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 No acute cervical spine fracture or posttraumatic cervical subluxation. Electronically Signed: By: Ashley Royalty M.D. On: 02/17/2017 18:27   Ct Abdomen Pelvis W Contrast  Result Date: 02/17/2017 CLINICAL DATA:  Fall.  Loss of consciousness.  Left arm pain. EXAM: CT CHEST, ABDOMEN, AND PELVIS WITH CONTRAST TECHNIQUE: Multidetector CT imaging of the chest, abdomen and pelvis was performed following the standard protocol during bolus administration of intravenous contrast. CONTRAST:  115mL ISOVUE-300  IOPAMIDOL (ISOVUE-300) INJECTION 61% COMPARISON:  None. FINDINGS: CT CHEST FINDINGS Cardiovascular: Mild degradation secondary to extensive EKG wires and leads and resultant beam hardening artifact. Patient arm position also degraded exam. No evidence of aortic laceration or mediastinal hematoma. Tortuous thoracic aorta. Mild cardiomegaly, without pericardial effusion. Aortic atherosclerosis. Mediastinum/Nodes: No mediastinal or hilar adenopathy. Lungs/Pleura: No pleural fluid. No pneumothorax. 4 mm left upper lobe pulmonary nodule on image 41/ series 6. Minimal bibasilar subsegmental atelectasis dependently. Minimal posterior left upper  lobe subpleural nodularity is likely due to a subpleural lymph node. No evidence of pulmonary contusion. Musculoskeletal: No chest wall hematoma. There is fluid, likely representing hemorrhage, within the low neck and supraclavicular regions bilaterally. Nondisplaced fracture involving the left transverse process of T1 on image 2/series 5. Mid right and distal left clavicular fractures. Both clavicular fractures are minimally comminuted, but relatively mildly displaced. Left glenohumeral joint osteoarthritis. CT ABDOMEN PELVIS FINDINGS Hepatobiliary: Multifactorial degradation continuing into the upper abdomen. Normal liver. Normal gallbladder, without biliary ductal dilatation. Pancreas: Normal, without mass or ductal dilatation. Spleen: Normal in size, without focal abnormality. Adrenals/Urinary Tract: Normal adrenal glands. Scarred right kidney. Normal left kidney. Hyperattenuation within the collecting systems is favored to be due to early contrast excretion rather than collecting system calculi. Normal urinary bladder. Stomach/Bowel: Normal stomach, without wall thickening. Large amount of stool within the sigmoid and rectum. Normal terminal ileum and appendix. Normal small bowel. No free intraperitoneal air. Vascular/Lymphatic: Aortic and branch vessel atherosclerosis. No  abdominopelvic adenopathy. Reproductive: Probable hysterectomy versus uterine atrophy. No adnexal mass. Other: No significant free fluid. Musculoskeletal: Extensive bruising about the lateral left abdominal and upper pelvic subcutaneous tissues. No well-defined hematoma or evidence of active extravasation. Degenerative irregularity about the symphysis pubis. lumbosacral spondylosis. IMPRESSION: 1. Bilateral clavicular fractures and a left T1 transverse process fracture. 2. Extensive bruising about the left abdominal and upper pelvic walls. No intraabdominal/pelvic posttraumatic abnormality identified. 3.  Aortic Atherosclerosis (ICD10-I70.0). 4. 4 mm left upper lobe pulmonary nodule. No follow-up needed if patient is low-risk. Non-contrast chest CT can be considered in 12 months if patient is high-risk. This recommendation follows the consensus statement: Guidelines for Management of Incidental Pulmonary Nodules Detected on CT Images: From the Fleischner Society 2017; Radiology 2017; 284:228-243. 5. Mild limitations as above. Electronically Signed   By: Abigail Miyamoto M.D.   On: 02/17/2017 18:34   Dg Shoulder Left  Result Date: 02/17/2017 CLINICAL DATA:  Recent fall with known humeral and clavicular fractures EXAM: LEFT SHOULDER - 2+ VIEW COMPARISON:  None. FINDINGS: No dislocation of the proximal humerus is noted. Mildly downward displaced left clavicular fracture is noted in the mid to distal portion. No other focal abnormality is seen. IMPRESSION: Mid to distal left clavicular fracture. Electronically Signed   By: Inez Catalina M.D.   On: 02/17/2017 18:54   Dg Humerus Left  Result Date: 02/17/2017 CLINICAL DATA:  Recent fall with arm pain, initial encounter EXAM: LEFT HUMERUS - 2+ VIEW COMPARISON:  None. FINDINGS: Comminuted distal humeral fracture is noted as previously described with intra-articular extension. Impaction is noted at the fracture site. Considerable soft tissue swelling is noted. No  proximal humeral fracture is seen. The underlying bony thorax is within normal limits. A minimally displaced mid to distal left clavicular fracture is seen. IMPRESSION: Distal left humeral fracture and mid to distal left clavicular fracture. Electronically Signed   By: Inez Catalina M.D.   On: 02/17/2017 18:53   Dg Hand Complete Right  Result Date: 02/17/2017 CLINICAL DATA:  Recent fall with right hand pain, initial encounter EXAM: RIGHT HAND - COMPLETE 3+ VIEW COMPARISON:  None. FINDINGS: The previously seen comminuted fracture of the distal right radius is again identified with intra-articular extension. Mild degenerative changes are noted in the interphalangeal joints. No other fracture or dislocation is seen. IMPRESSION: Comminuted distal right radial fracture Electronically Signed   By: Inez Catalina M.D.   On: 02/17/2017 18:47   Ct Maxillofacial Wo Contrast  Addendum Date: 02/17/2017  ADDENDUM REPORT: 02/17/2017 18:34 ADDENDUM: There is an acute appearing left transverse process fracture T1. Electronically Signed   By: Ashley Royalty M.D.   On: 02/17/2017 18:34   Result Date: 02/17/2017 CLINICAL DATA:  Posttraumatic fall from attic today with loss of consciousness. EXAM: CT HEAD WITHOUT CONTRAST CT MAXILLOFACIAL WITHOUT CONTRAST CT CERVICAL SPINE WITHOUT CONTRAST TECHNIQUE: Multidetector CT imaging of the head, cervical spine, and maxillofacial structures were performed using the standard protocol without intravenous contrast. Multiplanar CT image reconstructions of the cervical spine and maxillofacial structures were also generated. COMPARISON:  MRI of the brain 08/02/2009 common cervical spine MRI 09/14/2008 FINDINGS: CT HEAD FINDINGS Brain: Mild superficial atrophy. No acute intracranial hemorrhage, midline shift or edema. No intra-axial mass nor extra-axial fluid collections. No hydrocephalus. No effacement of the basal cisterns or fourth ventricle. Minimal small vessel ischemic changes of  periventricular white matter bilaterally. Vascular: No hyperdense vessels. Mild atherosclerosis of the carotid siphons right greater than left. Skull: No skull fracture or suspicious osseous lesions. Other: None CT MAXILLOFACIAL FINDINGS Osseous: No acute maxillofacial fracture. Orbits: Intact orbits and globes. No retrobulbar hematoma. Bilateral cataract extractions. Sinuses: Trace air-fluid level within the left sphenoid sinus with mild ethmoid and right maxillary sinus mucosal thickening. Soft tissues: Left premalar soft tissue contusion. CT CERVICAL SPINE FINDINGS Alignment: Slight straightening of cervical lordosis which may be due to positioning or muscle spasm. Intact atlantodental interval and craniocervical relationship. Skull base and vertebrae: No acute fracture. No primary bone lesion or focal pathologic process. Soft tissues and spinal canal: No prevertebral fluid or swelling. No visible canal hematoma. Disc levels: Moderate disc space narrowing at C6-7 with small posterior marginal osteophytes and uncovertebral joint osteoarthritis. Slight left-sided neural foraminal encroachment from uncovertebral joint spurring at C6-7. Upper chest: Negative. Other: None IMPRESSION: 1. Minimal small vessel ischemic disease of periventricular white matter. No acute intracranial abnormality. 2. Soft tissue contusion of the left cheek. No maxillofacial fracture. 3. 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 No acute cervical spine fracture or posttraumatic cervical subluxation. Electronically Signed: By: Ashley Royalty M.D. On: 02/17/2017 18:27    Anti-infectives: Anti-infectives    None      Assessment/Plan:  71 yo female with fall 1 story with right distal radial fracture, left distal humerus fracture, b/l clavicle fractures, elevated lactate after 1 l fluid  Will check xray of left foot Consult PT IVF Repeat labs Ortho and hand said that they will see as outpt in clinic.    LOS: 1 day    Lori Deleon A 02/18/2017

## 2017-02-18 NOTE — Progress Notes (Signed)
CRITICAL VALUE ALERT  Critical Value: Lactic Acid 2.4 Date & Time Notied: 02/18/17 0232 Provider Notified: Dr. Kieth Brightly Orders Received/Actions taken: none

## 2017-02-19 LAB — BASIC METABOLIC PANEL
ANION GAP: 5 (ref 5–15)
BUN: 17 mg/dL (ref 6–20)
CALCIUM: 8.6 mg/dL — AB (ref 8.9–10.3)
CO2: 25 mmol/L (ref 22–32)
CREATININE: 0.92 mg/dL (ref 0.44–1.00)
Chloride: 109 mmol/L (ref 101–111)
GFR calc Af Amer: >60 mL/min (ref >60)
GFR calc non Af Amer: >60 mL/min (ref >60)
Glucose, Bld: 129 mg/dL — ABNORMAL HIGH (ref 65–99)
Potassium: 3.9 mmol/L (ref 3.5–5.1)
Sodium: 139 mmol/L (ref 135–145)

## 2017-02-19 LAB — CBC
HCT: 30.2 % — ABNORMAL LOW (ref 36.0–46.0)
HEMOGLOBIN: 10.3 g/dL — AB (ref 12.0–15.0)
MCH: 29.6 pg (ref 26.0–34.0)
MCHC: 34.1 g/dL (ref 30.0–36.0)
MCV: 86.8 fL (ref 78.0–100.0)
Platelets: 80 10*3/uL — ABNORMAL LOW (ref 150–400)
RBC: 3.48 MIL/uL — ABNORMAL LOW (ref 3.87–5.11)
RDW: 16.6 % — AB (ref 11.5–15.5)
WBC: 5 10*3/uL (ref 4.0–10.5)

## 2017-02-19 LAB — LACTIC ACID, PLASMA: LACTIC ACID, VENOUS: 1.3 mmol/L (ref 0.5–1.9)

## 2017-02-19 MED ORDER — AMLODIPINE BESYLATE 10 MG PO TABS
10.0000 mg | ORAL_TABLET | Freq: Every day | ORAL | Status: DC
  Administered 2017-02-19 – 2017-02-20 (×2): 10 mg via ORAL
  Filled 2017-02-19 (×2): qty 1

## 2017-02-19 MED ORDER — LOSARTAN POTASSIUM 50 MG PO TABS
50.0000 mg | ORAL_TABLET | Freq: Every day | ORAL | Status: DC
  Administered 2017-02-19 – 2017-02-20 (×2): 50 mg via ORAL
  Filled 2017-02-19 (×2): qty 1

## 2017-02-19 MED ORDER — ENOXAPARIN SODIUM 40 MG/0.4ML ~~LOC~~ SOLN
40.0000 mg | SUBCUTANEOUS | Status: DC
  Administered 2017-02-20: 40 mg via SUBCUTANEOUS
  Filled 2017-02-19 (×3): qty 0.4

## 2017-02-19 NOTE — Progress Notes (Signed)
[facility] Progress Note     Subjective: CC: dizziness, pain in UEs Patient has had a little dizziness this AM with getting up, resolved currently. Pain in BL UEs, controlled with pain medication. Left foot pain resolved from yesterday. Patient denies abdominal pain, n/v. Tolerating diet, had a BM yesterday. BP up a little this AM, just got medication for this.  Patient states she has family members that live at home with her, but they are all working during the day. Does have stairs to get into her home. Would like to go home if able.   Objective: Vital signs in last 24 hours: Temp:  [98.3 F (36.8 C)-98.6 F (37 C)] 98.6 F (37 C) (10/22 0445) Pulse Rate:  [79-82] 80 (10/22 0445) Resp:  [16-18] 16 (10/22 0445) BP: (157-176)/(65-77) 176/77 (10/22 0445) SpO2:  [97 %] 97 % (10/22 0445) Last BM Date: 02/17/17  Intake/Output from previous day: 10/21 0701 - 10/22 0700 In: 1958.3 [P.O.:720; I.V.:1238.3] Out: 1101 [Urine:1100; Stool:1] Intake/Output this shift: No intake/output data recorded.  PE: Gen:  Alert, NAD, pleasant Card:  Regular rate and rhythm, pedal pulses 2+ BL Pulm:  Normal effort, clear to auscultation bilaterally Abd: Soft, non-tender, non-distended, +BS, no HSM Ext: RUE in short arm splint, fingers WWP, sensation and motor intact; LUE in long arm splint, fingers WWP, sensation and motor intact; Left non-tender, mild swelling.  Skin: warm and dry, no rashes  Psych: A&Ox3   Lab Results:   Recent Labs  02/17/17 1600 02/17/17 1620 02/18/17 0056  WBC 9.9  --  7.5  HGB 12.8 14.6 11.5*  HCT 37.9 43.0 33.7*  PLT 101*  --  87*   BMET  Recent Labs  02/17/17 1600 02/17/17 1620 02/18/17 0056  NA 135 139 133*  K 3.5 4.0 3.9  CL 102 101 105  CO2 22  --  19*  GLUCOSE 162* 162* 143*  BUN 24* 34* 20  CREATININE 0.98 0.90 0.84  CALCIUM 8.9  --  8.3*   PT/INR  Recent Labs  02/17/17 1600  LABPROT 14.6  INR 1.15   CMP     Component Value  Date/Time   NA 133 (L) 02/18/2017 0056   K 3.9 02/18/2017 0056   CL 105 02/18/2017 0056   CO2 19 (L) 02/18/2017 0056   GLUCOSE 143 (H) 02/18/2017 0056   BUN 20 02/18/2017 0056   CREATININE 0.84 02/18/2017 0056   CALCIUM 8.3 (L) 02/18/2017 0056   PROT 7.0 02/17/2017 1600   ALBUMIN 4.1 02/17/2017 1600   AST 42 (H) 02/17/2017 1600   ALT 27 02/17/2017 1600   ALKPHOS 50 02/17/2017 1600   BILITOT 1.1 02/17/2017 1600   GFRNONAA >60 02/18/2017 0056   GFRAA >60 02/18/2017 0056   Lipase  No results found for: LIPASE     Studies/Results: Dg Elbow 2 Views Left  Result Date: 02/17/2017 CLINICAL DATA:  Recent fall with left elbow pain, initial encounter EXAM: LEFT ELBOW - 2 VIEW COMPARISON:  None FINDINGS: Comminuted fracture of the distal left humerus is noted with impaction at the fracture site. Fracture lines extend into joint space. No dislocation is noted. Considerable soft tissue swelling is noted. IMPRESSION: Comminuted distal left humeral fracture with impaction at the fracture site in extension into the articular surface. Electronically Signed   By: Inez Catalina M.D.   On: 02/17/2017 18:46   Dg Wrist Complete Right  Result Date: 02/17/2017 CLINICAL DATA:  Recent fall with wrist and hand pain, initial encounter  EXAM: RIGHT WRIST - COMPLETE 3+ VIEW COMPARISON:  None. FINDINGS: Comminuted distal radial fracture is noted with extension into the articular space in multiple areas. The carpal bones appear intact. Mild posterior angulation at the fracture site is noted. Soft tissue swelling is seen. IMPRESSION: Comminuted distal radial fracture with multiple fracture lines extending to the articular surface. Electronically Signed   By: Inez Catalina M.D.   On: 02/17/2017 18:45   Ct Head Wo Contrast  Addendum Date: 02/17/2017   ADDENDUM REPORT: 02/17/2017 18:34 ADDENDUM: There is an acute appearing left transverse process fracture T1. Electronically Signed   By: Ashley Royalty M.D.   On:  02/17/2017 18:34   Result Date: 02/17/2017 CLINICAL DATA:  Posttraumatic fall from attic today with loss of consciousness. EXAM: CT HEAD WITHOUT CONTRAST CT MAXILLOFACIAL WITHOUT CONTRAST CT CERVICAL SPINE WITHOUT CONTRAST TECHNIQUE: Multidetector CT imaging of the head, cervical spine, and maxillofacial structures were performed using the standard protocol without intravenous contrast. Multiplanar CT image reconstructions of the cervical spine and maxillofacial structures were also generated. COMPARISON:  MRI of the brain 08/02/2009 common cervical spine MRI 09/14/2008 FINDINGS: CT HEAD FINDINGS Brain: Mild superficial atrophy. No acute intracranial hemorrhage, midline shift or edema. No intra-axial mass nor extra-axial fluid collections. No hydrocephalus. No effacement of the basal cisterns or fourth ventricle. Minimal small vessel ischemic changes of periventricular white matter bilaterally. Vascular: No hyperdense vessels. Mild atherosclerosis of the carotid siphons right greater than left. Skull: No skull fracture or suspicious osseous lesions. Other: None CT MAXILLOFACIAL FINDINGS Osseous: No acute maxillofacial fracture. Orbits: Intact orbits and globes. No retrobulbar hematoma. Bilateral cataract extractions. Sinuses: Trace air-fluid level within the left sphenoid sinus with mild ethmoid and right maxillary sinus mucosal thickening. Soft tissues: Left premalar soft tissue contusion. CT CERVICAL SPINE FINDINGS Alignment: Slight straightening of cervical lordosis which may be due to positioning or muscle spasm. Intact atlantodental interval and craniocervical relationship. Skull base and vertebrae: No acute fracture. No primary bone lesion or focal pathologic process. Soft tissues and spinal canal: No prevertebral fluid or swelling. No visible canal hematoma. Disc levels: Moderate disc space narrowing at C6-7 with small posterior marginal osteophytes and uncovertebral joint osteoarthritis. Slight left-sided  neural foraminal encroachment from uncovertebral joint spurring at C6-7. Upper chest: Negative. Other: None IMPRESSION: 1. Minimal small vessel ischemic disease of periventricular white matter. No acute intracranial abnormality. 2. Soft tissue contusion of the left cheek. No maxillofacial fracture. 3. 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 No acute cervical spine fracture or posttraumatic cervical subluxation. Electronically Signed: By: Ashley Royalty M.D. On: 02/17/2017 18:27   Ct Chest W Contrast  Result Date: 02/17/2017 CLINICAL DATA:  Fall.  Loss of consciousness.  Left arm pain. EXAM: CT CHEST, ABDOMEN, AND PELVIS WITH CONTRAST TECHNIQUE: Multidetector CT imaging of the chest, abdomen and pelvis was performed following the standard protocol during bolus administration of intravenous contrast. CONTRAST:  125mL ISOVUE-300 IOPAMIDOL (ISOVUE-300) INJECTION 61% COMPARISON:  None. FINDINGS: CT CHEST FINDINGS Cardiovascular: Mild degradation secondary to extensive EKG wires and leads and resultant beam hardening artifact. Patient arm position also degraded exam. No evidence of aortic laceration or mediastinal hematoma. Tortuous thoracic aorta. Mild cardiomegaly, without pericardial effusion. Aortic atherosclerosis. Mediastinum/Nodes: No mediastinal or hilar adenopathy. Lungs/Pleura: No pleural fluid. No pneumothorax. 4 mm left upper lobe pulmonary nodule on image 41/ series 6. Minimal bibasilar subsegmental atelectasis dependently. Minimal posterior left upper lobe subpleural nodularity is likely due to a subpleural lymph node. No evidence of  pulmonary contusion. Musculoskeletal: No chest wall hematoma. There is fluid, likely representing hemorrhage, within the low neck and supraclavicular regions bilaterally. Nondisplaced fracture involving the left transverse process of T1 on image 2/series 5. Mid right and distal left clavicular fractures. Both clavicular fractures are minimally  comminuted, but relatively mildly displaced. Left glenohumeral joint osteoarthritis. CT ABDOMEN PELVIS FINDINGS Hepatobiliary: Multifactorial degradation continuing into the upper abdomen. Normal liver. Normal gallbladder, without biliary ductal dilatation. Pancreas: Normal, without mass or ductal dilatation. Spleen: Normal in size, without focal abnormality. Adrenals/Urinary Tract: Normal adrenal glands. Scarred right kidney. Normal left kidney. Hyperattenuation within the collecting systems is favored to be due to early contrast excretion rather than collecting system calculi. Normal urinary bladder. Stomach/Bowel: Normal stomach, without wall thickening. Large amount of stool within the sigmoid and rectum. Normal terminal ileum and appendix. Normal small bowel. No free intraperitoneal air. Vascular/Lymphatic: Aortic and branch vessel atherosclerosis. No abdominopelvic adenopathy. Reproductive: Probable hysterectomy versus uterine atrophy. No adnexal mass. Other: No significant free fluid. Musculoskeletal: Extensive bruising about the lateral left abdominal and upper pelvic subcutaneous tissues. No well-defined hematoma or evidence of active extravasation. Degenerative irregularity about the symphysis pubis. lumbosacral spondylosis. IMPRESSION: 1. Bilateral clavicular fractures and a left T1 transverse process fracture. 2. Extensive bruising about the left abdominal and upper pelvic walls. No intraabdominal/pelvic posttraumatic abnormality identified. 3.  Aortic Atherosclerosis (ICD10-I70.0). 4. 4 mm left upper lobe pulmonary nodule. No follow-up needed if patient is low-risk. Non-contrast chest CT can be considered in 12 months if patient is high-risk. This recommendation follows the consensus statement: Guidelines for Management of Incidental Pulmonary Nodules Detected on CT Images: From the Fleischner Society 2017; Radiology 2017; 284:228-243. 5. Mild limitations as above. Electronically Signed   By: Abigail Miyamoto M.D.   On: 02/17/2017 18:34   Ct Cervical Spine Wo Contrast  Addendum Date: 02/17/2017   ADDENDUM REPORT: 02/17/2017 18:34 ADDENDUM: There is an acute appearing left transverse process fracture T1. Electronically Signed   By: Ashley Royalty M.D.   On: 02/17/2017 18:34   Result Date: 02/17/2017 CLINICAL DATA:  Posttraumatic fall from attic today with loss of consciousness. EXAM: CT HEAD WITHOUT CONTRAST CT MAXILLOFACIAL WITHOUT CONTRAST CT CERVICAL SPINE WITHOUT CONTRAST TECHNIQUE: Multidetector CT imaging of the head, cervical spine, and maxillofacial structures were performed using the standard protocol without intravenous contrast. Multiplanar CT image reconstructions of the cervical spine and maxillofacial structures were also generated. COMPARISON:  MRI of the brain 08/02/2009 common cervical spine MRI 09/14/2008 FINDINGS: CT HEAD FINDINGS Brain: Mild superficial atrophy. No acute intracranial hemorrhage, midline shift or edema. No intra-axial mass nor extra-axial fluid collections. No hydrocephalus. No effacement of the basal cisterns or fourth ventricle. Minimal small vessel ischemic changes of periventricular white matter bilaterally. Vascular: No hyperdense vessels. Mild atherosclerosis of the carotid siphons right greater than left. Skull: No skull fracture or suspicious osseous lesions. Other: None CT MAXILLOFACIAL FINDINGS Osseous: No acute maxillofacial fracture. Orbits: Intact orbits and globes. No retrobulbar hematoma. Bilateral cataract extractions. Sinuses: Trace air-fluid level within the left sphenoid sinus with mild ethmoid and right maxillary sinus mucosal thickening. Soft tissues: Left premalar soft tissue contusion. CT CERVICAL SPINE FINDINGS Alignment: Slight straightening of cervical lordosis which may be due to positioning or muscle spasm. Intact atlantodental interval and craniocervical relationship. Skull base and vertebrae: No acute fracture. No primary bone lesion or focal  pathologic process. Soft tissues and spinal canal: No prevertebral fluid or swelling. No visible canal hematoma. Disc levels: Moderate disc space  narrowing at C6-7 with small posterior marginal osteophytes and uncovertebral joint osteoarthritis. Slight left-sided neural foraminal encroachment from uncovertebral joint spurring at C6-7. Upper chest: Negative. Other: None IMPRESSION: 1. Minimal small vessel ischemic disease of periventricular white matter. No acute intracranial abnormality. 2. Soft tissue contusion of the left cheek. No maxillofacial fracture. 3. 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 No acute cervical spine fracture or posttraumatic cervical subluxation. Electronically Signed: By: Ashley Royalty M.D. On: 02/17/2017 18:27   Ct Abdomen Pelvis W Contrast  Result Date: 02/17/2017 CLINICAL DATA:  Fall.  Loss of consciousness.  Left arm pain. EXAM: CT CHEST, ABDOMEN, AND PELVIS WITH CONTRAST TECHNIQUE: Multidetector CT imaging of the chest, abdomen and pelvis was performed following the standard protocol during bolus administration of intravenous contrast. CONTRAST:  194mL ISOVUE-300 IOPAMIDOL (ISOVUE-300) INJECTION 61% COMPARISON:  None. FINDINGS: CT CHEST FINDINGS Cardiovascular: Mild degradation secondary to extensive EKG wires and leads and resultant beam hardening artifact. Patient arm position also degraded exam. No evidence of aortic laceration or mediastinal hematoma. Tortuous thoracic aorta. Mild cardiomegaly, without pericardial effusion. Aortic atherosclerosis. Mediastinum/Nodes: No mediastinal or hilar adenopathy. Lungs/Pleura: No pleural fluid. No pneumothorax. 4 mm left upper lobe pulmonary nodule on image 41/ series 6. Minimal bibasilar subsegmental atelectasis dependently. Minimal posterior left upper lobe subpleural nodularity is likely due to a subpleural lymph node. No evidence of pulmonary contusion. Musculoskeletal: No chest wall hematoma. There is  fluid, likely representing hemorrhage, within the low neck and supraclavicular regions bilaterally. Nondisplaced fracture involving the left transverse process of T1 on image 2/series 5. Mid right and distal left clavicular fractures. Both clavicular fractures are minimally comminuted, but relatively mildly displaced. Left glenohumeral joint osteoarthritis. CT ABDOMEN PELVIS FINDINGS Hepatobiliary: Multifactorial degradation continuing into the upper abdomen. Normal liver. Normal gallbladder, without biliary ductal dilatation. Pancreas: Normal, without mass or ductal dilatation. Spleen: Normal in size, without focal abnormality. Adrenals/Urinary Tract: Normal adrenal glands. Scarred right kidney. Normal left kidney. Hyperattenuation within the collecting systems is favored to be due to early contrast excretion rather than collecting system calculi. Normal urinary bladder. Stomach/Bowel: Normal stomach, without wall thickening. Large amount of stool within the sigmoid and rectum. Normal terminal ileum and appendix. Normal small bowel. No free intraperitoneal air. Vascular/Lymphatic: Aortic and branch vessel atherosclerosis. No abdominopelvic adenopathy. Reproductive: Probable hysterectomy versus uterine atrophy. No adnexal mass. Other: No significant free fluid. Musculoskeletal: Extensive bruising about the lateral left abdominal and upper pelvic subcutaneous tissues. No well-defined hematoma or evidence of active extravasation. Degenerative irregularity about the symphysis pubis. lumbosacral spondylosis. IMPRESSION: 1. Bilateral clavicular fractures and a left T1 transverse process fracture. 2. Extensive bruising about the left abdominal and upper pelvic walls. No intraabdominal/pelvic posttraumatic abnormality identified. 3.  Aortic Atherosclerosis (ICD10-I70.0). 4. 4 mm left upper lobe pulmonary nodule. No follow-up needed if patient is low-risk. Non-contrast chest CT can be considered in 12 months if patient is  high-risk. This recommendation follows the consensus statement: Guidelines for Management of Incidental Pulmonary Nodules Detected on CT Images: From the Fleischner Society 2017; Radiology 2017; 284:228-243. 5. Mild limitations as above. Electronically Signed   By: Abigail Miyamoto M.D.   On: 02/17/2017 18:34   Dg Shoulder Left  Result Date: 02/17/2017 CLINICAL DATA:  Recent fall with known humeral and clavicular fractures EXAM: LEFT SHOULDER - 2+ VIEW COMPARISON:  None. FINDINGS: No dislocation of the proximal humerus is noted. Mildly downward displaced left clavicular fracture is noted in the mid to distal portion. No other focal abnormality  is seen. IMPRESSION: Mid to distal left clavicular fracture. Electronically Signed   By: Inez Catalina M.D.   On: 02/17/2017 18:54   Dg Humerus Left  Result Date: 02/17/2017 CLINICAL DATA:  Recent fall with arm pain, initial encounter EXAM: LEFT HUMERUS - 2+ VIEW COMPARISON:  None. FINDINGS: Comminuted distal humeral fracture is noted as previously described with intra-articular extension. Impaction is noted at the fracture site. Considerable soft tissue swelling is noted. No proximal humeral fracture is seen. The underlying bony thorax is within normal limits. A minimally displaced mid to distal left clavicular fracture is seen. IMPRESSION: Distal left humeral fracture and mid to distal left clavicular fracture. Electronically Signed   By: Inez Catalina M.D.   On: 02/17/2017 18:53   Dg Hand Complete Right  Result Date: 02/17/2017 CLINICAL DATA:  Recent fall with right hand pain, initial encounter EXAM: RIGHT HAND - COMPLETE 3+ VIEW COMPARISON:  None. FINDINGS: The previously seen comminuted fracture of the distal right radius is again identified with intra-articular extension. Mild degenerative changes are noted in the interphalangeal joints. No other fracture or dislocation is seen. IMPRESSION: Comminuted distal right radial fracture Electronically Signed   By: Inez Catalina M.D.   On: 02/17/2017 18:47   Dg Foot 2 Views Left  Result Date: 02/18/2017 CLINICAL DATA:  [place] from the attic 1 story, onto garage floor yesterday. Multiple other fractures. Pain. EXAM: LEFT FOOT - 2 VIEW COMPARISON:  None. FINDINGS: Mild soft tissue swelling. Talus and calcaneus appear grossly intact. Multiple bones of the midfoot overlap.It is difficult to exclude a midfoot fracture on these AP and lateral portable radiographs. The metatarsals and phalanges appear intact without dislocation or fracture. IMPRESSION: Portable radiographs insufficient to exclude a midfoot fracture. Consider CT scan of the foot without contrast for further evaluation, if concern for occult fracture. Electronically Signed   By: Staci Righter M.D.   On: 02/18/2017 13:24   Ct Maxillofacial Wo Contrast  Addendum Date: 02/17/2017   ADDENDUM REPORT: 02/17/2017 18:34 ADDENDUM: There is an acute appearing left transverse process fracture T1. Electronically Signed   By: Ashley Royalty M.D.   On: 02/17/2017 18:34   Result Date: 02/17/2017 CLINICAL DATA:  Posttraumatic fall from attic today with loss of consciousness. EXAM: CT HEAD WITHOUT CONTRAST CT MAXILLOFACIAL WITHOUT CONTRAST CT CERVICAL SPINE WITHOUT CONTRAST TECHNIQUE: Multidetector CT imaging of the head, cervical spine, and maxillofacial structures were performed using the standard protocol without intravenous contrast. Multiplanar CT image reconstructions of the cervical spine and maxillofacial structures were also generated. COMPARISON:  MRI of the brain 08/02/2009 common cervical spine MRI 09/14/2008 FINDINGS: CT HEAD FINDINGS Brain: Mild superficial atrophy. No acute intracranial hemorrhage, midline shift or edema. No intra-axial mass nor extra-axial fluid collections. No hydrocephalus. No effacement of the basal cisterns or fourth ventricle. Minimal small vessel ischemic changes of periventricular white matter bilaterally. Vascular: No hyperdense vessels. Mild  atherosclerosis of the carotid siphons right greater than left. Skull: No skull fracture or suspicious osseous lesions. Other: None CT MAXILLOFACIAL FINDINGS Osseous: No acute maxillofacial fracture. Orbits: Intact orbits and globes. No retrobulbar hematoma. Bilateral cataract extractions. Sinuses: Trace air-fluid level within the left sphenoid sinus with mild ethmoid and right maxillary sinus mucosal thickening. Soft tissues: Left premalar soft tissue contusion. CT CERVICAL SPINE FINDINGS Alignment: Slight straightening of cervical lordosis which may be due to positioning or muscle spasm. Intact atlantodental interval and craniocervical relationship. Skull base and vertebrae: No acute fracture. No primary bone lesion or focal  pathologic process. Soft tissues and spinal canal: No prevertebral fluid or swelling. No visible canal hematoma. Disc levels: Moderate disc space narrowing at C6-7 with small posterior marginal osteophytes and uncovertebral joint osteoarthritis. Slight left-sided neural foraminal encroachment from uncovertebral joint spurring at C6-7. Upper chest: Negative. Other: None IMPRESSION: 1. Minimal small vessel ischemic disease of periventricular white matter. No acute intracranial abnormality. 2. Soft tissue contusion of the left cheek. No maxillofacial fracture. 3. 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 No acute cervical spine fracture or posttraumatic cervical subluxation. Electronically Signed: By: Ashley Royalty M.D. On: 02/17/2017 18:27    Anti-infectives: Anti-infectives    None       Assessment/Plan Fall R distal radial fx - Dr Burney Gauze recommending splint with outpatient follow up Left distal humerus fx - Dr. Lorin Mercy recommending splint and outpatient follow up  BL clavicle fxs - per Dr. Lorin Mercy Left foot pain - XR negative but recommend CT to rule out occult fx - pain currently resolved, will order CT if pain worsens  FEN: regular diet VTE: SCDs,  lovenox ID: no current abx  Plan: Possibly home this afternoon.   LOS: 2 days    Brigid Re , [facility] Surgery 02/19/2017, 8:54 AM Pager: [phone] Trauma Pager: [phone] Mon-Fri 7:00 am-4:30 pm Sat-Sun 7:00 am-11:30 am

## 2017-02-19 NOTE — Discharge Instructions (Signed)
Cast or Splint Care, Adult Casts and splints are supports that are worn to protect broken bones and other injuries. A cast or splint may hold a bone still and in the correct position while it heals. Casts and splints may also help to ease pain, swelling, and muscle spasms. How to care for your cast  Do not stick anything inside the cast to scratch your skin.  Check the skin around the cast every day. Tell your doctor about any concerns.  You may put lotion on dry skin around the edges of the cast. Do not put lotion on the skin under the cast.  Keep the cast clean.  If the cast is not waterproof: ? Do not let it get wet. ? Cover it with a watertight covering when you take a bath or a shower. How to care for your splint  Wear it as told by your doctor. Take it off only as told by your doctor.  Loosen the splint if your fingers or toes tingle, get numb, or turn cold and blue.  Keep the splint clean.  If the splint is not waterproof: ? Do not let it get wet. ? Cover it with a watertight covering when you take a bath or a shower. Follow these instructions at home: Bathing  Do not take baths or swim until your doctor says it is okay. Ask your doctor if you can take showers. You may only be allowed to take sponge baths for bathing.  If your cast or splint is not waterproof, cover it with a watertight covering when you take a bath or shower. Managing pain, stiffness, and swelling  Move your fingers or toes often to avoid stiffness and to lessen swelling.  Raise (elevate) the injured area above the level of your heart while sitting or lying down. Safety  Do not use the injured limb to support your body weight until your doctor says that it is okay.  Use crutches or other assistive devices as told by your doctor. General instructions  Do not put pressure on any part of the cast or splint until it is fully hardened. This may take many hours.  Return to your normal activities as  told by your doctor. Ask your doctor what activities are safe for you.  Keep all follow-up visits as told by your doctor. This is important. Contact a doctor if:  Your cast or splint gets damaged.  The skin around the cast gets red or raw.  The skin under the cast is very itchy or painful.  Your cast or splint feels very uncomfortable.  Your cast or splint is too tight or too loose.  Your cast becomes wet or it starts to have a soft spot or area.  You get an object stuck under your cast. Get help right away if:  Your pain gets worse.  The injured area tingles, gets numb, or turns blue and cold.  The part of your body above or below the cast is swollen and it turns a different color (is discolored).  You cannot feel or move your fingers or toes.  There is fluid leaking through the cast.  You have very bad pain or pressure under the cast.  You have trouble breathing.  You have shortness of breath.  You have chest pain. This information is not intended to replace advice given to you by your health care provider. Make sure you discuss any questions you have with your health care provider. Document Released: 08/17/2010 Document   Revised: 04/07/2016 Document Reviewed: 04/07/2016 Elsevier Interactive Patient Education  2017 Elsevier Inc.  

## 2017-02-19 NOTE — Evaluation (Signed)
Physical Therapy Evaluation Patient Details Name: Lori Deleon MRN: [id number] DOB: [birth year] Today's Date: 02/19/2017   History of Present Illness  71 yo female in attic above garage was hit by storage box and lost balance and fell down on the garage floor. Pt presenting with right distal radial fracture, left distal humerus fracture, and bilateral clavicle fractures. PMH including HTN, GERD, and arthritis.   Clinical Impression   Patient is s/p above surgery resulting in functional limitations due to the deficits listed below (see PT Problem List). PTA, pt was independent with all mobility. Pt presents today with good strength in BLE but requires min guarding with transfers due to unfamilarity with dynamic balance in light of BUE restrictions. Limited session due to resting BP=179/65. Per RN, pt will receive BP medications sometime this morning, and will visit patient again to continue gait and stair training for safe d/c home.  Patient will benefit from skilled PT to increase their independence and safety with mobility to allow discharge to the venue listed below.       Follow Up Recommendations Home health PT    Equipment Recommendations  None recommended by PT    Recommendations for Other Services       Precautions / Restrictions Precautions Precautions: Fall;Other (comment) Restrictions RUE Weight Bearing: Non weight bearing LUE Weight Bearing: Non weight bearing Other Position/Activity Restrictions: No formal order. assume NWB.      Mobility  Bed Mobility Overal bed mobility: Needs Assistance Bed Mobility: Supine to Sit     Supine to sit: Min assist     General bed mobility comments: Min A to elevate trunk and cues to adhere to weight bearing precautions with BUE.   Transfers Overall transfer level: Needs assistance Equipment used: None Transfers: Sit to/from Stand Sit to Stand: Min guard         General transfer comment: Min Guard for safety, stability  with dynamic balance  Ambulation/Gait Ambulation/Gait assistance: Min guard Ambulation Distance (Feet): 5 Feet Assistive device: None Gait Pattern/deviations: WFL(Within Functional Limits)        Stairs            Wheelchair Mobility    Modified Rankin (Stroke Patients Only)       Balance Overall balance assessment: Needs assistance Sitting-balance support: No upper extremity supported Sitting balance-Leahy Scale: Fair     Standing balance support: No upper extremity supported Standing balance-Leahy Scale: Fair Standing balance comment: Min Guard for standing dynamic balance due to PT adjusting to BUE postions                             Pertinent Vitals/Pain Pain Assessment: 0-10 Pain Score: 5  Pain Location: RUE and LUE Pain Descriptors / Indicators: Constant;Discomfort;Grimacing Pain Intervention(s): Repositioned;Limited activity within patient's tolerance;Monitored during session    [place] expects to be discharged to:: Private residence Living Arrangements: Children Available Help at Discharge: Family;Available PRN/intermittently (son and daughter in law work throughout the day) Type of Home: House Home Access: Stairs to enter Entrance Stairs-Rails: Psychiatric nurse of Steps: [place]: Two level [place]: None;Shower seat - built in      Prior Function Level of Independence: Independent               Hand Dominance        Extremity/Trunk Assessment        Lower Extremity Assessment Lower Extremity Assessment: Overall WFL for tasks  assessed (Strength 4/5 globally BLE)       Communication   Communication: No difficulties  Cognition Arousal/Alertness: Awake/alert (Tired) Behavior During Therapy: WFL for tasks assessed/performed Overall Cognitive Status: Within Functional Limits for tasks assessed                                        General Comments  General comments (skin integrity, edema, etc.): Pt agreeable to mobility, will visit again today when BP medications are on board per Nurse    Exercises     Assessment/Plan    PT Assessment Patient needs continued PT services  PT Problem List         PT Treatment Interventions Functional mobility training;Therapeutic activities;Gait training    PT Goals (Current goals can be found in the Care Plan section)  Acute Rehab PT Goals Patient Stated Goal: return to PLOF PT Goal Formulation: With patient Time For Goal Achievement: 02/23/17 Potential to Achieve Goals: Good    Frequency Min 3X/week   Barriers to discharge        Co-evaluation               AM-PAC PT "6 Clicks" Daily Activity  Outcome Measure Difficulty turning over in bed (including adjusting bedclothes, sheets and blankets)?: Unable Difficulty moving from lying on back to sitting on the side of the bed? : Unable Difficulty sitting down on and standing up from a chair with arms (e.g., wheelchair, bedside commode, etc,.)?: Unable Help needed moving to and from a bed to chair (including a wheelchair)?: A Little Help needed walking in hospital room?: A Little Help needed climbing 3-5 steps with a railing? : A Little 6 Click Score: 12    End of Session Equipment Utilized During Treatment: Gait belt Activity Tolerance: Patient limited by pain Patient left: in chair;with call bell/phone within reach Nurse Communication: Mobility status (BP meds) PT Visit Diagnosis: Other abnormalities of gait and mobility (R26.89)    Time: 8413-2440 PT Time Calculation (min) (ACUTE ONLY): 21 min   Charges:   PT Evaluation $PT Eval Low Complexity: 1 Low PT Treatments $Therapeutic Activity: 8-22 mins   PT G Codes:        Reinaldo Berber, PT, DPT Acute Rehab Pager: [phone]   Reinaldo Berber 02/19/2017, 9:32 AM

## 2017-02-19 NOTE — Progress Notes (Signed)
RN received call from [place], patient's daughter-in-law, who is self identified as an Surveyor, quantity of pharmacy at [place] wanted to know about patient's blood pressure medications that are not listed on patient's daily medication regimen. Medications include Losartan 25 mg tab daily and Amlodipine 10 mg daily. RN informed on-call Trauma x3 with no response to messages. RN returned call to [facility] to notify her that no changes have been made in patient's daily medication regimen. She asked RN to file a Safety Zone Portal on the occurrence. Nursing will continue to monitor.

## 2017-02-19 NOTE — Care Management Note (Signed)
Case Management Note  Patient Details  Name: [name] MRN: [id number] Date of Birth: June 04, 1945  Subjective/Objective:    Pt admitted on 02/17/17 after falling one story with RT distal radial fx, LT distal humerus fx, and bilateral clavicle fx.  PTA, pt independent, lives at home with son and daughter in Sports coach.                Action/Plan: PT/OT recommending HH follow up, 3 in 1 BSC.   Pt defers to son [name] for discharge arrangements, though she is agreeable to home therapy.  Spoke with pt's son [name] by phone; he is planning to arrange private duty/PCS services to assist pt at home with ADLS.  He states he will bring in [facility] paperwork for provider completion.  Son states he has no preference for [facility] needs; will refer to [facility] for [facility] and DME needs.    Expected Discharge Date:   02/20/17           Expected Discharge Plan:  [place]  In-House Referral:  Clinical Social Work  Discharge planning Services  CM Consult  Post Acute Care Choice:   Home health services Choice offered to:   Patient/ Adult Children  DME Arranged:   3 in 1 [facility] DME Agency:   [place] Arranged:   PT, OT [place] Agency:   [facility]  Status of Service:  Completed, will sign off If discussed at Long Length of Stay Meetings, dates discussed:    Additional Comments:  Ella Bodo, RN 02/19/2017, 5:01 PM

## 2017-02-19 NOTE — Progress Notes (Signed)
Occupational Therapy Treatment Patient Details Name: Lori Deleon MRN: [id number] DOB: March 26, 1946 Today's Date: 02/19/2017    History of present illness 71 yo female in attic above garage was hit by storage box and lost balance and fell down on the garage floor. Pt presenting with right distal radial fracture, left distal humerus fracture, and bilateral clavicle fractures. PMH including HTN, GERD, and arthritis.    OT comments  Pt progressing towards goals, continues to demonstrate limitations in ability to complete ADLs due to pain in bil UEs, functional limitations, and immobilization (spoke with trauma PA who confirmed will follow up with MD for clarification on UE ROM restrictions). Education provided on compensatory techniques for completing UB ADLs with Pt verbalizing understanding, though continues to require MaxA to complete this session. Pt also with increased lethargy this PM and nodding off towards end of session completion. Continue to recommend Pt have 24 hr assist upon initial return home secondary to Pt's difficulty with completing functional tasks. Will plan to follow up prior to discharge to continue progressing Pt's safety and independence with ADLs and functional mobility and to further assess Pt's need for assist after discharge home.    Follow Up Recommendations  Home health OT;Supervision/Assistance - 24 hour (HHaide)    Equipment Recommendations  3 in 1 bedside commode          Precautions / Restrictions Precautions Precautions: Fall;Other (comment) Restrictions Weight Bearing Restrictions: Yes RUE Weight Bearing: Non weight bearing LUE Weight Bearing: Non weight bearing Other Position/Activity Restrictions: Paged and spoke with trauma PA, is checking with MD for clarification on formal WB and ROM restrictions; maintained no A/PROM in L shoulder        Mobility Bed Mobility Overal bed mobility: Needs Assistance Bed Mobility: Supine to Sit;Sit to Supine      Supine to sit: Min assist Sit to supine: Min assist   General bed mobility comments: light Min A to elevate trunk and cues to adhere to weight bearing precautions with BUE.   Transfers Overall transfer level: Needs assistance   Transfers: Sit to/from Stand Sit to Stand: Supervision         General transfer comment: Pt able to sit<>stand with supervision for safety    Balance Overall balance assessment: Needs assistance Sitting-balance support: No upper extremity supported Sitting balance-Leahy Scale: Good     Standing balance support: No upper extremity supported Standing balance-Leahy Scale: Good                             ADL either performed or assessed with clinical judgement   ADL Overall ADL's : Needs assistance/impaired Eating/Feeding: With adaptive utensils;Sitting Eating/Feeding Details (indicate cue type and reason): Pt verbalizes she was able to feed her self today; per RN Pt was not able to feed herself breakfast this AM, issued foam handle for increased ease of task completion              Upper Body Dressing : Maximal assistance;Sitting Upper Body Dressing Details (indicate cue type and reason): educated Pt on UB dressing techniques; Pt requires increased assist to perform due to pain in RUE and immobilization of LUE; educated on donning/doffing sling and proper positioning with Pt verbalizing understanding of education;                 Functional mobility during ADLs: Min guard General ADL Comments: Pt requires MinGuard for functional mobility but continues to require MaxA for  ADL completion secondary to bil UE injuries and immobilization; educated on edema reduction technques, RUE elbow and forearm ROM, bil UE digit ROM                        Cognition Arousal/Alertness: Awake/alert;Lethargic Behavior During Therapy: WFL for tasks assessed/performed Overall Cognitive Status: Within Functional Limits for tasks assessed                                  General Comments: Pt becoming more lethargic as session progressed, nodding off while seated in recliner towards end of session         Exercises Shoulder Exercises Elbow Flexion: AROM;Right;Seated;5 reps Elbow Extension: AROM;Right;Seated;5 reps Digit Composite Flexion: AROM;Both;5 reps;Seated Composite Extension: AROM;Both;5 reps;Seated Neck Flexion: AROM;Seated Neck Extension: AROM;Seated Neck Lateral Flexion - Right: AROM;Seated Neck Lateral Flexion - Left: AROM;Seated Hand Exercises Forearm Supination: AROM;5 reps;Seated;Right Forearm Pronation: AROM;5 reps;Seated;Right Digit Composite Flexion: AROM;Seated;Both Composite Extension: AROM;Both;Seated                Pertinent Vitals/ Pain       Pain Assessment: Faces Faces Pain Scale: Hurts even more Pain Location: RUE and LUE Pain Descriptors / Indicators: Constant;Discomfort;Grimacing Pain Intervention(s): Limited activity within patient's tolerance;Repositioned;Monitored during session                                                          Frequency  Min 3X/week        Progress Toward Goals  OT Goals(current goals can now be found in the care plan section)  Progress towards OT goals: Progressing toward goals  Acute Rehab OT Goals Patient Stated Goal: Return Home OT Goal Formulation: With patient Time For Goal Achievement: 03/04/17 Potential to Achieve Goals: Good  Plan Discharge plan remains appropriate                     AM-PAC PT "6 Clicks" Daily Activity     Outcome Measure   Help from another person eating meals?: A Lot Help from another person taking care of personal grooming?: A Lot Help from another person toileting, which includes using toliet, bedpan, or urinal?: A Lot Help from another person bathing (including washing, rinsing, drying)?: A Lot Help from another person to put on and taking off regular upper  body clothing?: A Lot Help from another person to put on and taking off regular lower body clothing?: A Lot 6 Click Score: 12    End of Session Equipment Utilized During Treatment: Other (comment) (bil shoulder immobilizers )  OT Visit Diagnosis: Unsteadiness on feet (R26.81);Other abnormalities of gait and mobility (R26.89);Muscle weakness (generalized) (M62.81);Pain Pain - Right/Left:  (bilateral ) Pain - part of body: Shoulder;Hand;Arm   Activity Tolerance Patient tolerated treatment well   Patient Left in bed;with call bell/phone within reach;with family/visitor present   Nurse Communication Mobility status        Time: 6812-7517 OT Time Calculation (min): 27 min  Charges: OT General Charges $OT Visit: 1 Visit OT Treatments $Self Care/Home Management : 23-37 mins  Lou Cal, OT Pager [phone] 02/19/2017    Raymondo Band 02/19/2017, 5:31 PM

## 2017-02-19 NOTE — Progress Notes (Addendum)
Physical Therapy Treatment  Patient Details Name: Lori Deleon MRN: [id number] DOB: June 25, 1945 Today's Date: 02/19/2017    History of Present Illness 71 yo female in attic above garage was hit by storage box and lost balance and fell down on the garage floor. Pt presenting with right distal radial fracture, left distal humerus fracture, and bilateral clavicle fractures. PMH including HTN, GERD, and arthritis.     PT Comments    PM session focused on gait and stair training, as well as patient/family education regarding safety considerations for upcoming discharge home. Pt demonstrated increasing activity tolerance and strength since this AM session. Pt ambulated 125 feet and ascended/descended 12 stairs with supervision. Cued patient to decreased speed on stairs to ensure safe dynamic balance in light of BUE restrictions. Will cont to follow to improve/ensure safe dynamic balance and reinforce today's education and mechanics.    Follow Up Recommendations  Home health PT;Supervision - Intermittent     Equipment Recommendations  None recommended by PT    Recommendations for Other Services       Precautions / Restrictions Precautions Precautions: Fall;Other (comment) Restrictions RUE Weight Bearing: Non weight bearing LUE Weight Bearing: Non weight bearing Other Position/Activity Restrictions: No formal order. assume NWB.    Mobility  Bed Mobility                  Transfers Overall transfer level: Needs assistance Equipment used: None Transfers: Sit to/from Stand Sit to Stand: Supervision         General transfer comment: Pt able to sit<>stand with supervision for safety  Ambulation/Gait Ambulation/Gait assistance: Supervision Ambulation Distance (Feet): 125 Feet Assistive device: None Gait Pattern/deviations: WFL(Within Functional Limits)         Stairs Stairs: Yes   Stair Management: No rails Number of Stairs: 12 General stair comments:  Educated family proper position to guard, cued patient to decreased velocity to ensure safety.  Wheelchair Mobility    Modified Rankin (Stroke Patients Only)       Balance   Sitting-balance support: No upper extremity supported Sitting balance-Leahy Scale: Good     Standing balance support: No upper extremity supported Standing balance-Leahy Scale: Good                              Cognition Arousal/Alertness: Awake/alert Behavior During Therapy: WFL for tasks assessed/performed Overall Cognitive Status: Within Functional Limits for tasks assessed                                        Exercises      General Comments General comments (skin integrity, edema, etc.): Daughter in law present throughout session. Discussed safety considerations for return to home.       Pertinent Vitals/Pain Pain Assessment: 0-10 Pain Score: 5  Pain Location: RUE and LUE Pain Descriptors / Indicators: Constant;Discomfort;Grimacing Pain Intervention(s): Limited activity within patient's tolerance;Monitored during session;Repositioned    Home Living                      Prior Function            PT Goals (current goals can now be found in the care plan section) Acute Rehab PT Goals Patient Stated Goal: Return Home PT Goal Formulation: With patient/family Time For Goal Achievement: 02/23/17 Potential to Achieve Goals: Good Progress  towards PT goals: Pt progressing towards goals    Frequency           PT Plan Current plan remains appropriate    Co-evaluation              AM-PAC PT "6 Clicks" Daily Activity  Outcome Measure  Difficulty turning over in bed (including adjusting bedclothes, sheets and blankets)?: Unable Difficulty moving from lying on back to sitting on the side of the bed? : Unable Difficulty sitting down on and standing up from a chair with arms (e.g., wheelchair, bedside commode, etc,.)?: A Little Help needed  moving to and from a bed to chair (including a wheelchair)?: None Help needed walking in hospital room?: None Help needed climbing 3-5 steps with a railing? : None 6 Click Score: 17    End of Session Equipment Utilized During Treatment: Gait belt Activity Tolerance: Patient tolerated treatment well Patient left: in bed;with call bell/phone within reach;with family/visitor present   PT Visit Diagnosis: Pain;Other abnormalities of gait and mobility (R26.89)     Time: 6333-5456 PT Time Calculation (min) (ACUTE ONLY): 20 min  Charges:  $Gait Training: 8-22 mins                    G Codes:       Reinaldo Berber, PT, DPT Acute Rehab Pager: [phone]   Reinaldo Berber 02/19/2017, 1:21 PM

## 2017-02-19 NOTE — Progress Notes (Signed)
RN notified MD regarding lab's request for a foot stick order because patient has bilateral arm splints. Nursing will continue to monitor.

## 2017-02-19 NOTE — Clinical Social Work Note (Signed)
Clinical Social Worker met with patient at bedside to offer support and discuss patient needs at discharge.  Patient states that she lives at home with her son and daughter in law and plans to return at discharge.  Patient was in the attic retrieving a book when she fell through the ceiling onto the garage floor.  Patient states that she yelled for help, however no one heard her until her daughter in law returned home and opened the garage door.  Patient with no additional needs - family to arrange some additional home support.  Clinical Social Worker inquired about current substance use.  Patient states that there is no current use at this time and no need for any resources.  SBIRT completed.  Clinical Social Worker will sign off for now as social work intervention is no longer needed. Please consult [place] again if new need arises.  Barbette Or, [place]

## 2017-02-20 DIAGNOSIS — S42402A Unspecified fracture of lower end of left humerus, initial encounter for closed fracture: Secondary | ICD-10-CM

## 2017-02-20 DIAGNOSIS — S52501A Unspecified fracture of the lower end of right radius, initial encounter for closed fracture: Secondary | ICD-10-CM

## 2017-02-20 DIAGNOSIS — S42001A Fracture of unspecified part of right clavicle, initial encounter for closed fracture: Secondary | ICD-10-CM

## 2017-02-20 MED ORDER — HYDRALAZINE HCL 20 MG/ML IJ SOLN: 10.0000 mg | Freq: Once | INTRAMUSCULAR | Status: DC

## 2017-02-20 MED ORDER — OXYCODONE HCL 5 MG PO TABS: 5.0000 mg | ORAL_TABLET | ORAL | 0 refills | Status: DC | PRN

## 2017-02-20 NOTE — Progress Notes (Signed)
Discharge instructions and prescriptions reviewed with patient and patient's son. Verbalized understanding. All follow up appointments reviewed. IV removed, catheter tip intact. No questions/concerns at this time. Transported via wheelchair to lobby for discharge.

## 2017-02-20 NOTE — Discharge Summary (Signed)
Physician Discharge Summary  Patient ID: [name] MRN: [id number] DOB/AGE: Apr 05, 1946 71 y.o.  Admit date: 02/17/2017 Discharge date: 02/20/2017  Discharge Diagnoses Patient Active Problem List   Diagnosis Date Noted  . Multiple fractures of clavicle 02/17/2017  . Neutropenia ([place]) 11/05/2015    Consultants None  Procedures None  HPI: Lori Deleon is a 71yo female who was brought to [facility] 10/20 after she was hit by a storage box and lost balance and fell down on the garage floor of her home. Unsure if she lost consciousness. She estimates she was on the ground for 1 hour before her step daughter came back to the house to find her. Upon presentation to the ED she was complaining of bilateral arm pain and shoulder pain. Workup revealed right distal radial fracture, left distal humerus fracture, b/l clavicle fractures, and elevated lactate after 1 liter fluid. Patient was admitted to trauma for fluid resuscitation, monitoring, and further workup. Orthopedic consults were called in the ED and recommended outpatient follow up. She was placed in a RUE short arm splint and LUE long arm splint, and recommended NWB BUE.  Hospital Course: Lactic acid normalized with IVF. After admission patient began complaining of left foot pain; xray was taken and negative for fracture. Left foot pain improved. Patient worked with therapies during this admission. On 10/23 the patient was tolerating diet, ambulating well, pain well controlled, vital signs stable, and felt stable for discharge home with home health PT/OT and 24 hour supervision. Patient will follow up with Dr. Burney Gauze and Dr. Lorin Mercy for her upper extremity injuries. She knows to call with any questions or concerns.   I was not directly involved in this patient's care, therefore the information in this discharge summary was taken from the chart.  Allergies as of 02/20/2017      Reactions   Sulfa Antibiotics Rash      Medication List     TAKE these medications   acetaminophen 500 MG tablet Commonly known as:  TYLENOL Take 1,000 mg by mouth.   amLODipine 10 MG tablet Commonly known as:  NORVASC Take 1 tablet by mouth daily.   calcium carbonate 600 MG Tabs tablet Commonly known as:  OS-CAL Take 600 mg by mouth daily with breakfast.   carboxymethylcellulose 0.5 % Soln Commonly known as:  REFRESH PLUS 1 drop daily as needed.   CORICIDIN HBP COUGH/COLD PO Take 1 tablet by mouth every 12 (twelve) hours as needed.   dexlansoprazole 60 MG capsule Commonly known as:  DEXILANT Take 60 mg by mouth daily.   hydroxypropyl methylcellulose / hypromellose 2.5 % ophthalmic solution Commonly known as:  ISOPTO TEARS / GONIOVISC Place 2 drops into both eyes as needed for dry eyes.   ketotifen 0.025 % ophthalmic solution Commonly known as:  ZADITOR Place 1 drop into both eyes daily.   losartan 50 MG tablet Commonly known as:  COZAAR Take 50 mg by mouth daily.   multivitamin tablet Take 1 tablet by mouth daily.   omega-3 fish oil 1000 MG Caps capsule Commonly known as:  MAXEPA Take 1 capsule by mouth 2 (two) times daily.   oxyCODONE 5 MG immediate release tablet Commonly known as:  Oxy IR/ROXICODONE Take 1 tablet (5 mg total) by mouth every 4 (four) hours as needed for moderate pain.   ranitidine 150 MG tablet Commonly known as:  ZANTAC Take 300 mg by mouth at bedtime.   VICKS VAPORUB 4.7-1.2-2.6 % Oint Apply 1 application topically daily as  needed (COUGH).   vitamin C 500 MG tablet Commonly known as:  ASCORBIC ACID Take 500 mg by mouth daily.   Vitamin D-3 1000 units Caps Take 1 capsule by mouth daily.            Durable Medical Equipment        Start     Ordered   02/19/17 0926  For home use only DME 3 n 1  Once     02/19/17 3300       Follow-up Information    Marybelle Killings, MD. Call.   Specialty:  Orthopedic Surgery Why:  Call when you leave the hospital and set up a follow up  appointment for your left arm and clavicles Contact information: [place] [address] [phone]        Charlotte Crumb, MD. Call.   Specialty:  Orthopedic Surgery Why:  Call when you leave the hospital to set up a follow up appointment 02/22/17 for your right wrist. Do not bear weight on your right wrist until seen by hand surgery.  Contact information: [place] [address] [phone]           Signed: Wellington Hampshire, [facility] Surgery 02/20/2017, 1:59 PM Pager: [phone] Consults: [phone] Mon-Fri 7:00 am-4:30 pm Sat-Sun 7:00 am-11:30 am

## 2017-02-20 NOTE — Care Management Note (Signed)
Case Management Note  Patient Details  Name: Lori Deleon MRN: [id number] Date of Birth: 1945/09/18  Subjective/Objective:    Pt admitted on 02/17/17 after falling one story with RT distal radial fx, LT distal humerus fx, and bilateral clavicle fx.  PTA, pt independent, lives at home with son and daughter in Sports coach.                Action/Plan: PT/OT recommending HH follow up, 3 in 1 BSC.   Pt defers to son [name] for discharge arrangements, though she is agreeable to home therapy.  Spoke with pt's son [name] by phone; he is planning to arrange private duty/PCS services to assist pt at home with ADLS.  He states he will bring in [facility] paperwork for provider completion.  Son states he has no preference for [facility] needs; will refer to [facility] for [facility] and DME needs.    Expected Discharge Date:  02/20/17             Expected Discharge Plan:  [place]  In-House Referral:  Clinical Social Work  Discharge planning Services  CM Consult  Post Acute Care Choice:   Home health services Choice offered to:  Patient, Adult Children  DME Arranged:  3-N-1 DME Agency:  [place]:  PT, OT   [place] Agency:  [facility] [place]   Status of Service:  Completed, will sign off If discussed at Long Length of Stay Meetings, dates discussed:    Additional Comments:  02/20/17 J. Daliya Parchment, Therapist, sports, BSN 1000 Pt medically stable for discharge home with family today.  PCS forms completed and signed by PA.  3 in 1 BSC to be delivered to room prior to dc.    Reinaldo Raddle, RN, BSN  Trauma/Neuro ICU Case Manager [phone]

## 2017-02-20 NOTE — Progress Notes (Signed)
Occupational Therapy Treatment Patient Details Name: Lori Deleon MRN: [id number] DOB: April 09, 1946 Today's Date: 02/20/2017    History of present illness 71 yo female in attic above garage was hit by storage box and lost balance and fell down on the garage floor. Pt presenting with right distal radial fracture, left distal humerus fracture, and bilateral clavicle fractures. PMH including HTN, GERD, and arthritis.    OT comments  Pt progressing towards goals, reviewed and further educated Pt on ADL completion while adhering to bil UE NWB and shoulder ROM restrictions during session. Pt demonstrates good carry over with Min verbal cues. Educated on AE for increasing ease of ADL task completion as well as reviewed how to instruct caregiver with assisting Pt during task completion. Feel Pt will progress well with continued practice, currently requires MinA for grooming and feeding ADLs, MaxA for dressing and ModA for toileting. Continue to recommend Pt have 24 hr assist initially upon return home as well as Sac services to progress Pt's safety and independence with ADLs and mobility completion. Will continue to follow acutely.    Follow Up Recommendations  Home health OT;Supervision/Assistance - 24 hour;Other (comment) (HHAide)    Equipment Recommendations  3 in 1 bedside commode          Precautions / Restrictions Precautions Precautions: Fall;Shoulder Shoulder Interventions: Shoulder sling/immobilizer Restrictions Weight Bearing Restrictions: Yes RUE Weight Bearing: Non weight bearing LUE Weight Bearing: Non weight bearing Other Position/Activity Restrictions: No ROM bil shoulders; ROM to R elbow okay        Mobility Bed Mobility Overal bed mobility: Needs Assistance Bed Mobility: Sit to Supine       Sit to supine: Min guard   General bed mobility comments: close guard for safety; min verbal cues for maintaining NWB in UEs during transfer   Transfers Overall transfer  level: Needs assistance Equipment used: None Transfers: Sit to/from Stand Sit to Stand: Supervision         General transfer comment: Pt able to sit<>stand with supervision for safety    Balance Overall balance assessment: Needs assistance Sitting-balance support: No upper extremity supported Sitting balance-Leahy Scale: Good     Standing balance support: No upper extremity supported Standing balance-Leahy Scale: Good                             ADL either performed or assessed with clinical judgement   ADL Overall ADL's : Needs assistance/impaired Eating/Feeding: With adaptive utensils;Sitting;Minimal assistance Eating/Feeding Details (indicate cue type and reason): Pt demonstrates ability to bring fork to mouth; able to bring lightweight cup to mouth using straw to drink  Grooming: Minimal assistance;Standing;Oral care;Set up Grooming Details (indicate cue type and reason): MinA for setup; Pt able to bring RUE to mouth without moving R shoulder to complete oral care            Upper Body Dressing Details (indicate cue type and reason): reviewed UB dressing techniques, Pt verbalizes how to have caregiver assist with task completion with mod verbal cues          Toileting- Clothing Manipulation and Hygiene: Moderate assistance;Sit to/from stand;With adaptive equipment Toileting - Clothing Manipulation Details (indicate cue type and reason): educated Pt on using toilet aide for completing peri care after toileting and pt eager to learn how to use AE, Pt currently still needs setup/Min assist for task completion, anticipate will need increased assist for peri care after BM due to shoulder  ROM restrictions      Functional mobility during ADLs: Min guard General ADL Comments: Pt reports feeling increasingly dizzy after mobility to bathroom whiel standing to perform oral care, returned Pt to sitting and BP taken, reading 150/89; Pt returned to supine/sitting up in bed  for the remainder of tx session, reports symptoms subsided with rest and return to sitting/supine                        Cognition Arousal/Alertness: Awake/alert Behavior During Therapy: WFL for tasks assessed/performed Overall Cognitive Status: Within Functional Limits for tasks assessed                                                            Pertinent Vitals/ Pain       Pain Assessment: Faces Faces Pain Scale: Hurts little more Pain Location: RUE and LUE Pain Descriptors / Indicators: Constant;Discomfort;Grimacing Pain Intervention(s): Limited activity within patient's tolerance;Monitored during session;Repositioned                                                          Frequency  Min 3X/week        Progress Toward Goals  OT Goals(current goals can now be found in the care plan section)  Progress towards OT goals: Progressing toward goals  Acute Rehab OT Goals Patient Stated Goal: Return Home OT Goal Formulation: With patient Time For Goal Achievement: 03/04/17 Potential to Achieve Goals: Good  Plan Discharge plan remains appropriate                     AM-PAC PT "6 Clicks" Daily Activity     Outcome Measure   Help from another person eating meals?: A Lot Help from another person taking care of personal grooming?: A Lot Help from another person toileting, which includes using toliet, bedpan, or urinal?: A Lot Help from another person bathing (including washing, rinsing, drying)?: A Lot Help from another person to put on and taking off regular upper body clothing?: A Lot Help from another person to put on and taking off regular lower body clothing?: A Lot 6 Click Score: 12    End of Session Equipment Utilized During Treatment: Other (comment) (bil shoulder immobilizers )  OT Visit Diagnosis: Unsteadiness on feet (R26.81);Other abnormalities of gait and mobility (R26.89);Muscle weakness  (generalized) (M62.81);Pain Pain - Right/Left:  (bilateral ) Pain - part of body: Shoulder;Hand;Arm   Activity Tolerance Patient tolerated treatment well   Patient Left in bed;with call bell/phone within reach;with family/visitor present   Nurse Communication Mobility status        Time: 3300-7622 OT Time Calculation (min): 37 min  Charges: OT General Charges $OT Visit: 1 Visit OT Treatments $Self Care/Home Management : 23-37 mins  Lou Cal, OT Pager [phone] 02/20/2017    Raymondo Band 02/20/2017, 11:44 AM

## 2017-02-20 NOTE — Consult Note (Signed)
[facility] CM Primary Care Navigator  02/20/2017  Lori Deleon [birth year] [id number]   Met with patient at the bedsideto identify possible discharge needs.  Patient reports having a fall at home from attic down to garage thatresulted to this admission.  Patient uses Product/process development scientist on [address] to obtain medications without difficulty. She states managing her own medications at home straight out of the containers.  Patient reports that she uses "[employer]" transportation to her doctors' appointments. Notified patient of [employer] transportation benefits.  Patient mentioned living with son [name]) and daughter in-law [name]) who serve as her primary caregivers at home.   Called Engineer, structural (spoke to [place]) and was confirmed that she is not listed as a patient in their system. Patient reports she has not been seen by providers from [facility] practice, instead, her primary care provider has been Dr. Gayland Curry with [facility] -which is not under [place] [facility].  Patient was encouraged to follow-up with her primary care providerafter discharge or when she returns home.   For questions, please contact:  Dannielle Huh, BSN, RN- [facility] Primary Care Navigator  Telephone: [phone] [place]

## 2017-02-20 NOTE — Progress Notes (Signed)
[facility] Progress Note     Subjective: CC: dizziness Patient is having some dizziness this AM, states she has not had her BP medicine yet this AM. She occasionally gets dizzy at home if she waits too long to take her BP medication and states this feels the same. Denies nausea or vomiting, blurred vision, headache. Reports some pain, but has not taken any pain medication since yesterday. Working well with therapies. Her son is hiring someone to help her at home during the day while family members are at work.   Objective: Vital signs in last 24 hours: Temp:  [98.4 F (36.9 C)-98.6 F (37 C)] 98.6 F (37 C) (10/23 0620) Pulse Rate:  [75-83] 83 (10/23 0620) Resp:  [16-18] 16 (10/23 0620) BP: (146-173)/(63-90) 171/90 (10/23 0620) SpO2:  [99 %] 99 % (10/23 0620) Last BM Date: 02/18/17  Intake/Output from previous day: 10/22 0701 - 10/23 0700 In: 360 [P.O.:360] Out: -  Intake/Output this shift: No intake/output data recorded.  PE: Gen:  Alert, NAD, pleasant Card:  Regular rate and rhythm, pedal pulses 2+ BL Pulm:  Normal effort, clear to auscultation bilaterally Abd: Soft, non-tender, non-distended, +BS, no HSM Ext: RUE in short arm splint, fingers WWP, sensation and motor intact; LUE in long arm splint, fingers WWP, sensation and motor intact; Left non-tender, mild swelling.  Skin: warm and dry, no rashes  Psych: A&Ox3   Lab Results:   Recent Labs  02/18/17 0056 02/19/17 1024  WBC 7.5 5.0  HGB 11.5* 10.3*  HCT 33.7* 30.2*  PLT 87* 80*   BMET  Recent Labs  02/18/17 0056 02/19/17 1024  NA 133* 139  K 3.9 3.9  CL 105 109  CO2 19* 25  GLUCOSE 143* 129*  BUN 20 17  CREATININE 0.84 0.92  CALCIUM 8.3* 8.6*   PT/INR  Recent Labs  02/17/17 1600  LABPROT 14.6  INR 1.15   CMP     Component Value Date/Time   NA 139 02/19/2017 1024   K 3.9 02/19/2017 1024   CL 109 02/19/2017 1024   CO2 25 02/19/2017 1024   GLUCOSE 129 (H) 02/19/2017 1024   BUN 17 02/19/2017 1024   CREATININE 0.92 02/19/2017 1024   CALCIUM 8.6 (L) 02/19/2017 1024   PROT 7.0 02/17/2017 1600   ALBUMIN 4.1 02/17/2017 1600   AST 42 (H) 02/17/2017 1600   ALT 27 02/17/2017 1600   ALKPHOS 50 02/17/2017 1600   BILITOT 1.1 02/17/2017 1600   GFRNONAA >60 02/19/2017 1024   GFRAA >60 02/19/2017 1024   Lipase  No results found for: LIPASE     Studies/Results: Dg Foot 2 Views Left  Result Date: 02/18/2017 CLINICAL DATA:  [place] [address] from the attic 1 story, onto garage floor yesterday. Multiple other fractures. Pain. EXAM: LEFT FOOT - 2 VIEW COMPARISON:  None. FINDINGS: Mild soft tissue swelling. Talus and calcaneus appear grossly intact. Multiple bones of the midfoot overlap.It is difficult to exclude a midfoot fracture on these AP and lateral portable radiographs. The metatarsals and phalanges appear intact without dislocation or fracture. IMPRESSION: Portable radiographs insufficient to exclude a midfoot fracture. Consider CT scan of the foot without contrast for further evaluation, if concern for occult fracture. Electronically Signed   By: Staci Righter M.D.   On: 02/18/2017 13:24    Anti-infectives: Anti-infectives    None       Assessment/Plan Fall R distal radial fx - Dr Burney Gauze recommending splint with outpatient follow up Left distal humerus fx -  Dr. Lorin Mercy recommending splint and outpatient follow up  BL clavicle fxs - per Dr. Lorin Mercy Left foot pain - XR negative but recommend CT to rule out occult fx - pain currently resolved, will order CT if pain worsens  FEN: regular diet VTE: SCDs, lovenox ID: no current abx  Plan: Home today after PT/OT. Follow up with Dr. Burney Gauze and Dr. Lorin Mercy. NWB BL UEs.   LOS: 3 days    Brigid Re , [facility] Surgery 02/20/2017, 8:19 AM Pager: [phone] Trauma Pager: [phone] Mon-Fri 7:00 am-4:30 pm Sat-Sun 7:00 am-11:30 am

## 2017-02-20 NOTE — Progress Notes (Signed)
Physical Therapy Treatment Patient Details Name: Lori Deleon MRN: [id number] DOB: 1945/11/16 Today's Date: 02/20/2017    History of Present Illness 71 yo female in attic above garage was hit by storage box and lost balance and fell down on the garage floor. Pt presenting with right distal radial fracture, left distal humerus fracture, and bilateral clavicle fractures. PMH including HTN, GERD, and arthritis.     PT Comments    Session focused on stair training and addressing any mobility concerns with patient and family in light of d/c plans. Pt demonstrating consistent safety with transfers, gait, and stairs at this time. Pt has improved since initial evaluation and become more stable on feet over past sessions. Pt has met all acute care goals at this time. PT to sign off. Recommending home health PT when medically cleared for d/c.     Follow Up Recommendations  Home health PT;Supervision - Intermittent     Equipment Recommendations  None recommended by PT    Recommendations for Other Services       Precautions / Restrictions Precautions Precautions: Fall;Shoulder Shoulder Interventions: Shoulder sling/immobilizer Restrictions Weight Bearing Restrictions: Yes RUE Weight Bearing: Non weight bearing LUE Weight Bearing: Non weight bearing Other Position/Activity Restrictions: No ROM bil shoulders; ROM to R elbow okay     Mobility  Bed Mobility Overal bed mobility: Needs Assistance Bed Mobility: Supine to Sit     Supine to sit: Min assist Sit to supine: Min guard   General bed mobility comments: min guarding to support trunk during supine<>sit to adhere to WB precautions.  Transfers Overall transfer level: Needs assistance Equipment used: None Transfers: Sit to/from Stand Sit to Stand: Supervision         General transfer comment: Pt able to sit<>stand with supervision for safety  Ambulation/Gait Ambulation/Gait assistance: Supervision Ambulation Distance  (Feet): 125 Feet Assistive device: None Gait Pattern/deviations: WFL(Within Functional Limits) Gait velocity: normal       Stairs Stairs: Yes   Stair Management: No rails Number of Stairs: 12 General stair comments: Pt demonstrating consistent safety on stairs over 2 sessions. Adhering with WB precautions.   Wheelchair Mobility    Modified Rankin (Stroke Patients Only)       Balance Overall balance assessment: Needs assistance Sitting-balance support: No upper extremity supported Sitting balance-Leahy Scale: Good     Standing balance support: No upper extremity supported Standing balance-Leahy Scale: Good                              Cognition Arousal/Alertness: Awake/alert Behavior During Therapy: WFL for tasks assessed/performed Overall Cognitive Status: Within Functional Limits for tasks assessed                                 General Comments: Pt reports feeling better since she recieved her BP medication       Exercises      General Comments General comments (skin integrity, edema, etc.): Daughter in law present throughout session. Reinforced safety considerations for return to home and assisted her connction with case managers to discuss d/c planning.      Pertinent Vitals/Pain Pain Assessment: 0-10 Pain Score: 5  Faces Pain Scale: Hurts little more Pain Location: RUE and LUE Pain Descriptors / Indicators: Constant;Discomfort;Grimacing Pain Intervention(s): Limited activity within patient's tolerance;Monitored during session    Home Living  Prior Function            PT Goals (current goals can now be found in the care plan section) Acute Rehab PT Goals Patient Stated Goal: Return Home with assist and family support PT Goal Formulation: With patient/family Time For Goal Achievement: 02/23/17 Potential to Achieve Goals: Good Progress towards PT goals: Goals met/education completed, patient  discharged from PT    Frequency           PT Plan Current plan remains appropriate    Co-evaluation              AM-PAC PT "6 Clicks" Daily Activity  Outcome Measure                   End of Session Equipment Utilized During Treatment: Gait belt Activity Tolerance: Patient tolerated treatment well Patient left: in bed;with call bell/phone within reach;with family/visitor present Nurse Communication: Mobility status PT Visit Diagnosis: Pain;Other abnormalities of gait and mobility (R26.89)     Time: 1200-1220 PT Time Calculation (min) (ACUTE ONLY): 20 min  Charges:  $Gait Training: 8-22 mins                    G Codes:       Reinaldo Berber, PT, DPT Acute Rehab Services Pager: [phone]     Reinaldo Berber 02/20/2017, 12:39 PM

## 2017-02-20 NOTE — Progress Notes (Signed)
Spoke with Margie Billet, PA regarding patient's blood pressure of 193/76 HR 87. Orders to recheck pressure. Pressure now down to 165/91 HR 80. Margie Billet, [place] states okay for patient to be discharged.

## 2017-02-22 ENCOUNTER — Encounter (HOSPITAL_COMMUNITY): Payer: Self-pay | Admitting: *Deleted

## 2017-02-22 ENCOUNTER — Other Ambulatory Visit: Payer: Self-pay | Admitting: Orthopedic Surgery

## 2017-02-22 DIAGNOSIS — S42412B Displaced simple supracondylar fracture without intercondylar fracture of left humerus, initial encounter for open fracture: Secondary | ICD-10-CM | POA: Diagnosis not present

## 2017-02-22 DIAGNOSIS — S52571A Other intraarticular fracture of lower end of right radius, initial encounter for closed fracture: Secondary | ICD-10-CM | POA: Diagnosis not present

## 2017-02-22 NOTE — Progress Notes (Signed)
Spoke with pt and her son for pre-op call. Pt denies cardiac history or chest pain. Pt states she is not diabetic.

## 2017-02-23 ENCOUNTER — Encounter (HOSPITAL_COMMUNITY)
Admission: RE | Disposition: A | Discharge status: Home / Self Care | Payer: Self-pay | Source: Ambulatory Visit | Attending: Student

## 2017-02-23 ENCOUNTER — Ambulatory Visit (HOSPITAL_COMMUNITY): Payer: Medicare HMO | Admitting: Anesthesiology

## 2017-02-23 ENCOUNTER — Observation Stay (HOSPITAL_COMMUNITY): Payer: Medicare HMO

## 2017-02-23 ENCOUNTER — Ambulatory Visit (INDEPENDENT_AMBULATORY_CARE_PROVIDER_SITE_OTHER): Payer: Self-pay | Admitting: Orthopaedic Surgery

## 2017-02-23 ENCOUNTER — Observation Stay (HOSPITAL_COMMUNITY)
Admission: RE | Admit: 2017-02-23 | Discharge: 2017-02-24 | Disposition: A | Discharge status: Home / Self Care | Payer: Medicare HMO | Source: Ambulatory Visit | Attending: Student | Admitting: Student

## 2017-02-23 ENCOUNTER — Ambulatory Visit (HOSPITAL_COMMUNITY): Payer: Medicare HMO

## 2017-02-23 ENCOUNTER — Encounter (HOSPITAL_COMMUNITY): Payer: Self-pay

## 2017-02-23 DIAGNOSIS — Z79899 Other long term (current) drug therapy: Secondary | ICD-10-CM | POA: Diagnosis not present

## 2017-02-23 DIAGNOSIS — M8580 Other specified disorders of bone density and structure, unspecified site: Secondary | ICD-10-CM | POA: Diagnosis not present

## 2017-02-23 DIAGNOSIS — S42422A Displaced comminuted supracondylar fracture without intercondylar fracture of left humerus, initial encounter for closed fracture: Secondary | ICD-10-CM | POA: Insufficient documentation

## 2017-02-23 DIAGNOSIS — I10 Essential (primary) hypertension: Secondary | ICD-10-CM | POA: Diagnosis not present

## 2017-02-23 DIAGNOSIS — S42412A Displaced simple supracondylar fracture without intercondylar fracture of left humerus, initial encounter for closed fracture: Secondary | ICD-10-CM | POA: Diagnosis not present

## 2017-02-23 DIAGNOSIS — W1789XA Other fall from one level to another, initial encounter: Secondary | ICD-10-CM | POA: Diagnosis not present

## 2017-02-23 DIAGNOSIS — S52571A Other intraarticular fracture of lower end of right radius, initial encounter for closed fracture: Secondary | ICD-10-CM | POA: Diagnosis not present

## 2017-02-23 DIAGNOSIS — S42032A Displaced fracture of lateral end of left clavicle, initial encounter for closed fracture: Secondary | ICD-10-CM | POA: Diagnosis not present

## 2017-02-23 DIAGNOSIS — S42001A Fracture of unspecified part of right clavicle, initial encounter for closed fracture: Secondary | ICD-10-CM | POA: Diagnosis not present

## 2017-02-23 DIAGNOSIS — W133XXA Fall through floor, initial encounter: Secondary | ICD-10-CM | POA: Diagnosis not present

## 2017-02-23 DIAGNOSIS — I517 Cardiomegaly: Secondary | ICD-10-CM | POA: Diagnosis not present

## 2017-02-23 DIAGNOSIS — K219 Gastro-esophageal reflux disease without esophagitis: Secondary | ICD-10-CM | POA: Insufficient documentation

## 2017-02-23 DIAGNOSIS — S52501A Unspecified fracture of the lower end of right radius, initial encounter for closed fracture: Secondary | ICD-10-CM | POA: Diagnosis not present

## 2017-02-23 DIAGNOSIS — S42492A Other displaced fracture of lower end of left humerus, initial encounter for closed fracture: Secondary | ICD-10-CM | POA: Diagnosis not present

## 2017-02-23 DIAGNOSIS — S42002A Fracture of unspecified part of left clavicle, initial encounter for closed fracture: Secondary | ICD-10-CM | POA: Diagnosis not present

## 2017-02-23 DIAGNOSIS — M25522 Pain in left elbow: Secondary | ICD-10-CM | POA: Diagnosis not present

## 2017-02-23 DIAGNOSIS — T148XXA Other injury of unspecified body region, initial encounter: Secondary | ICD-10-CM

## 2017-02-23 DIAGNOSIS — Z419 Encounter for procedure for purposes other than remedying health state, unspecified: Secondary | ICD-10-CM

## 2017-02-23 DIAGNOSIS — Z452 Encounter for adjustment and management of vascular access device: Secondary | ICD-10-CM

## 2017-02-23 DIAGNOSIS — Y92098 Other place in other non-institutional residence as the place of occurrence of the external cause: Secondary | ICD-10-CM | POA: Insufficient documentation

## 2017-02-23 HISTORY — PX: ORIF HUMERUS FRACTURE: SHX2126

## 2017-02-23 HISTORY — DX: Dyspnea, unspecified: R06.00

## 2017-02-23 HISTORY — PX: OPEN REDUCTION INTERNAL FIXATION (ORIF) DISTAL RADIAL FRACTURE: SHX5989

## 2017-02-23 SURGERY — OPEN REDUCTION INTERNAL FIXATION (ORIF) DISTAL HUMERUS FRACTURE
Anesthesia: General | Laterality: Right

## 2017-02-23 MED ORDER — VITAMIN D3 25 MCG (1000 UNIT) PO TABS
1000.0000 [IU] | ORAL_TABLET | Freq: Every day | ORAL | Status: DC
  Administered 2017-02-24: 1000 [IU] via ORAL
  Filled 2017-02-23 (×2): qty 1

## 2017-02-23 MED ORDER — OXYCODONE HCL 5 MG PO TABS
ORAL_TABLET | ORAL | Status: AC
  Filled 2017-02-23: qty 1

## 2017-02-23 MED ORDER — ONDANSETRON HCL 4 MG/2ML IJ SOLN: 4.0000 mg | Freq: Once | INTRAMUSCULAR | Status: DC | PRN

## 2017-02-23 MED ORDER — CEFAZOLIN SODIUM-DEXTROSE 2-4 GM/100ML-% IV SOLN
INTRAVENOUS | Status: AC
  Filled 2017-02-23: qty 100

## 2017-02-23 MED ORDER — CHLORHEXIDINE GLUCONATE 4 % EX LIQD: 60.0000 mL | Freq: Once | CUTANEOUS | Status: DC

## 2017-02-23 MED ORDER — BUPIVACAINE HCL (PF) 0.25 % IJ SOLN
INTRAMUSCULAR | Status: AC
  Filled 2017-02-23: qty 30

## 2017-02-23 MED ORDER — PHENYLEPHRINE 40 MCG/ML (10ML) SYRINGE FOR IV PUSH (FOR BLOOD PRESSURE SUPPORT)
PREFILLED_SYRINGE | INTRAVENOUS | Status: AC
  Filled 2017-02-23: qty 10

## 2017-02-23 MED ORDER — CEFAZOLIN SODIUM-DEXTROSE 2-3 GM-%(50ML) IV SOLR
INTRAVENOUS | Status: DC | PRN
  Administered 2017-02-23: 2 g via INTRAVENOUS

## 2017-02-23 MED ORDER — ARTIFICIAL TEARS OPHTHALMIC OINT
TOPICAL_OINTMENT | OPHTHALMIC | Status: DC | PRN
Start: 2017-02-23 — End: 2017-02-23
  Administered 2017-02-23: 1 via OPHTHALMIC

## 2017-02-23 MED ORDER — FENTANYL CITRATE (PF) 100 MCG/2ML IJ SOLN
25.0000 ug | INTRAMUSCULAR | Status: DC | PRN
  Administered 2017-02-23 (×3): 50 ug via INTRAVENOUS

## 2017-02-23 MED ORDER — VICKS VAPORUB 4.7-1.2-2.6 % EX OINT: 1.0000 "application " | TOPICAL_OINTMENT | Freq: Every day | CUTANEOUS | Status: DC | PRN

## 2017-02-23 MED ORDER — ONE-DAILY MULTI VITAMINS PO TABS: 1.0000 | ORAL_TABLET | Freq: Every day | ORAL | Status: DC

## 2017-02-23 MED ORDER — METHOCARBAMOL 1000 MG/10ML IJ SOLN
500.0000 mg | Freq: Four times a day (QID) | INTRAVENOUS | Status: DC | PRN
  Filled 2017-02-23: qty 5

## 2017-02-23 MED ORDER — PHENYLEPHRINE HCL 10 MG/ML IJ SOLN
INTRAMUSCULAR | Status: DC | PRN
  Administered 2017-02-23: 120 ug via INTRAVENOUS
  Administered 2017-02-23: 80 ug via INTRAVENOUS
  Administered 2017-02-23: 120 ug via INTRAVENOUS

## 2017-02-23 MED ORDER — VANCOMYCIN HCL 1000 MG IV SOLR
INTRAVENOUS | Status: DC | PRN
  Administered 2017-02-23: 1000 mg

## 2017-02-23 MED ORDER — OXYCODONE HCL 5 MG PO TABS
5.0000 mg | ORAL_TABLET | ORAL | Status: DC | PRN
  Administered 2017-02-23: 10 mg via ORAL
  Administered 2017-02-24: 5 mg via ORAL
  Administered 2017-02-24: 10 mg via ORAL
  Filled 2017-02-23 (×3): qty 2

## 2017-02-23 MED ORDER — MIDAZOLAM HCL 2 MG/2ML IJ SOLN
INTRAMUSCULAR | Status: AC
  Filled 2017-02-23: qty 2

## 2017-02-23 MED ORDER — 0.9 % SODIUM CHLORIDE (POUR BTL) OPTIME
TOPICAL | Status: DC | PRN
  Administered 2017-02-23: 1000 mL

## 2017-02-23 MED ORDER — METOPROLOL TARTARATE 1 MG/ML SYRINGE (5ML)
Status: DC | PRN
  Administered 2017-02-23: 2 mg via INTRAVENOUS
  Administered 2017-02-23: 1 mg via INTRAVENOUS

## 2017-02-23 MED ORDER — ADULT MULTIVITAMIN W/MINERALS CH
1.0000 | ORAL_TABLET | Freq: Every day | ORAL | Status: DC
  Administered 2017-02-24: 1 via ORAL
  Filled 2017-02-23: qty 1

## 2017-02-23 MED ORDER — FENTANYL CITRATE (PF) 250 MCG/5ML IJ SOLN
INTRAMUSCULAR | Status: AC
  Filled 2017-02-23: qty 5

## 2017-02-23 MED ORDER — ONDANSETRON HCL 4 MG/2ML IJ SOLN: 4.0000 mg | Freq: Four times a day (QID) | INTRAMUSCULAR | Status: DC | PRN

## 2017-02-23 MED ORDER — MIDAZOLAM HCL 2 MG/2ML IJ SOLN
INTRAMUSCULAR | Status: DC | PRN
  Administered 2017-02-23 (×2): 1 mg via INTRAVENOUS

## 2017-02-23 MED ORDER — LIDOCAINE 2% (20 MG/ML) 5 ML SYRINGE
INTRAMUSCULAR | Status: AC
  Filled 2017-02-23: qty 5

## 2017-02-23 MED ORDER — ACETAMINOPHEN 500 MG PO TABS: 1000.0000 mg | ORAL_TABLET | Freq: Three times a day (TID) | ORAL | Status: DC | PRN

## 2017-02-23 MED ORDER — SUGAMMADEX SODIUM 200 MG/2ML IV SOLN
INTRAVENOUS | Status: AC
  Filled 2017-02-23: qty 2

## 2017-02-23 MED ORDER — VITAMIN C 500 MG PO TABS
500.0000 mg | ORAL_TABLET | Freq: Every day | ORAL | Status: DC
  Administered 2017-02-24: 500 mg via ORAL
  Filled 2017-02-23: qty 1

## 2017-02-23 MED ORDER — LOSARTAN POTASSIUM 50 MG PO TABS
50.0000 mg | ORAL_TABLET | Freq: Every day | ORAL | Status: DC
  Administered 2017-02-24: 50 mg via ORAL
  Filled 2017-02-23 (×2): qty 1

## 2017-02-23 MED ORDER — GLYCOPYRROLATE 0.2 MG/ML IJ SOLN
INTRAMUSCULAR | Status: DC | PRN
  Administered 2017-02-23: 0.1 mg via INTRAVENOUS

## 2017-02-23 MED ORDER — HYPROMELLOSE (GONIOSCOPIC) 2.5 % OP SOLN: 2.0000 [drp] | OPHTHALMIC | Status: DC | PRN

## 2017-02-23 MED ORDER — LIDOCAINE HCL (CARDIAC) 20 MG/ML IV SOLN
INTRAVENOUS | Status: DC | PRN
  Administered 2017-02-23: 60 mg via INTRATRACHEAL

## 2017-02-23 MED ORDER — PROPOFOL 10 MG/ML IV BOLUS
INTRAVENOUS | Status: AC
  Filled 2017-02-23: qty 20

## 2017-02-23 MED ORDER — BACITRACIN ZINC 500 UNIT/GM EX OINT
TOPICAL_OINTMENT | CUTANEOUS | Status: AC
  Filled 2017-02-23: qty 28.35

## 2017-02-23 MED ORDER — METHOCARBAMOL 500 MG PO TABS
500.0000 mg | ORAL_TABLET | Freq: Four times a day (QID) | ORAL | Status: DC | PRN
  Administered 2017-02-23 – 2017-02-24 (×2): 500 mg via ORAL
  Filled 2017-02-23 (×2): qty 1

## 2017-02-23 MED ORDER — ROCURONIUM BROMIDE 10 MG/ML (PF) SYRINGE
PREFILLED_SYRINGE | INTRAVENOUS | Status: AC
  Filled 2017-02-23: qty 5

## 2017-02-23 MED ORDER — ARTIFICIAL TEARS OPHTHALMIC OINT
TOPICAL_OINTMENT | OPHTHALMIC | Status: AC
  Filled 2017-02-23: qty 3.5

## 2017-02-23 MED ORDER — FENTANYL CITRATE (PF) 250 MCG/5ML IJ SOLN
INTRAMUSCULAR | Status: DC | PRN
  Administered 2017-02-23 (×2): 50 ug via INTRAVENOUS
  Administered 2017-02-23: 100 ug via INTRAVENOUS
  Administered 2017-02-23: 25 ug via INTRAVENOUS
  Administered 2017-02-23: 75 ug via INTRAVENOUS

## 2017-02-23 MED ORDER — KETOTIFEN FUMARATE 0.025 % OP SOLN
1.0000 [drp] | Freq: Every day | OPHTHALMIC | Status: DC
  Administered 2017-02-24: 1 [drp] via OPHTHALMIC
  Filled 2017-02-23: qty 5

## 2017-02-23 MED ORDER — POLYVINYL ALCOHOL 1.4 % OP SOLN
2.0000 [drp] | OPHTHALMIC | Status: DC | PRN
  Filled 2017-02-23: qty 15

## 2017-02-23 MED ORDER — LACTATED RINGERS IV SOLN
INTRAVENOUS | Status: DC | PRN
  Administered 2017-02-23 (×2): via INTRAVENOUS

## 2017-02-23 MED ORDER — DOCUSATE SODIUM 100 MG PO CAPS
100.0000 mg | ORAL_CAPSULE | Freq: Two times a day (BID) | ORAL | Status: DC
  Administered 2017-02-23 – 2017-02-24 (×2): 100 mg via ORAL
  Filled 2017-02-23 (×2): qty 1

## 2017-02-23 MED ORDER — ONDANSETRON HCL 4 MG PO TABS: 4.0000 mg | ORAL_TABLET | Freq: Four times a day (QID) | ORAL | Status: DC | PRN

## 2017-02-23 MED ORDER — ROCURONIUM BROMIDE 100 MG/10ML IV SOLN
INTRAVENOUS | Status: DC | PRN
  Administered 2017-02-23: 50 mg via INTRAVENOUS
  Administered 2017-02-23: 25 mg via INTRAVENOUS

## 2017-02-23 MED ORDER — OMEGA-3-ACID ETHYL ESTERS 1 G PO CAPS
1.0000 g | ORAL_CAPSULE | Freq: Two times a day (BID) | ORAL | Status: DC
  Administered 2017-02-23 – 2017-02-24 (×2): 1 g via ORAL
  Filled 2017-02-23 (×2): qty 1

## 2017-02-23 MED ORDER — ACETAMINOPHEN 325 MG PO TABS: 650.0000 mg | ORAL_TABLET | Freq: Four times a day (QID) | ORAL | Status: DC | PRN

## 2017-02-23 MED ORDER — CARBOXYMETHYLCELLULOSE SODIUM 0.5 % OP SOLN: 1.0000 [drp] | Freq: Every day | OPHTHALMIC | Status: DC | PRN

## 2017-02-23 MED ORDER — TOBRAMYCIN SULFATE 1.2 G IJ SOLR
INTRAMUSCULAR | Status: DC | PRN
  Administered 2017-02-23: 1.2 g

## 2017-02-23 MED ORDER — PHENYLEPHRINE HCL 10 MG/ML IJ SOLN
INTRAVENOUS | Status: DC | PRN
  Administered 2017-02-23: 50 ug/min via INTRAVENOUS

## 2017-02-23 MED ORDER — FENTANYL CITRATE (PF) 100 MCG/2ML IJ SOLN
INTRAMUSCULAR | Status: AC
  Filled 2017-02-23: qty 2

## 2017-02-23 MED ORDER — ACETAMINOPHEN 650 MG RE SUPP: 650.0000 mg | Freq: Four times a day (QID) | RECTAL | Status: DC | PRN

## 2017-02-23 MED ORDER — OXYCODONE HCL 5 MG PO TABS
5.0000 mg | ORAL_TABLET | ORAL | Status: DC | PRN
  Administered 2017-02-23: 5 mg via ORAL
  Filled 2017-02-23: qty 1

## 2017-02-23 MED ORDER — METOPROLOL TARTRATE 5 MG/5ML IV SOLN
INTRAVENOUS | Status: AC
  Filled 2017-02-23: qty 5

## 2017-02-23 MED ORDER — OMEGA-3 FISH OIL 1000 MG PO CAPS: 1.0000 | ORAL_CAPSULE | Freq: Two times a day (BID) | ORAL | Status: DC

## 2017-02-23 MED ORDER — DEXAMETHASONE SODIUM PHOSPHATE 10 MG/ML IJ SOLN
INTRAMUSCULAR | Status: DC | PRN
  Administered 2017-02-23: 10 mg via INTRAVENOUS

## 2017-02-23 MED ORDER — MORPHINE SULFATE (PF) 4 MG/ML IV SOLN
2.0000 mg | INTRAVENOUS | Status: DC | PRN
  Administered 2017-02-23: 2 mg via INTRAVENOUS
  Filled 2017-02-23: qty 1

## 2017-02-23 MED ORDER — REMIFENTANIL HCL 1 MG IV SOLR
0.0500 ug/kg/min | INTRAVENOUS | Status: AC
  Administered 2017-02-23: .1 ug/kg/min via INTRAVENOUS
  Filled 2017-02-23: qty 5000

## 2017-02-23 MED ORDER — ONDANSETRON HCL 4 MG/2ML IJ SOLN
INTRAMUSCULAR | Status: AC
  Filled 2017-02-23: qty 2

## 2017-02-23 MED ORDER — CALCIUM CARBONATE 1250 (500 CA) MG PO TABS
1.0000 | ORAL_TABLET | Freq: Every day | ORAL | Status: DC
  Administered 2017-02-24: 500 mg via ORAL
  Filled 2017-02-23: qty 1

## 2017-02-23 MED ORDER — SUGAMMADEX SODIUM 200 MG/2ML IV SOLN
INTRAVENOUS | Status: DC | PRN
  Administered 2017-02-23: 150 mg via INTRAVENOUS

## 2017-02-23 MED ORDER — ONDANSETRON HCL 4 MG/2ML IJ SOLN
INTRAMUSCULAR | Status: DC | PRN
  Administered 2017-02-23 (×2): 4 mg via INTRAVENOUS

## 2017-02-23 MED ORDER — DEXAMETHASONE SODIUM PHOSPHATE 10 MG/ML IJ SOLN
INTRAMUSCULAR | Status: AC
  Filled 2017-02-23: qty 1

## 2017-02-23 MED ORDER — PROPOFOL 10 MG/ML IV BOLUS
INTRAVENOUS | Status: DC | PRN
  Administered 2017-02-23: 150 mg via INTRAVENOUS

## 2017-02-23 MED ORDER — AMLODIPINE BESYLATE 10 MG PO TABS
10.0000 mg | ORAL_TABLET | Freq: Every day | ORAL | Status: DC
  Administered 2017-02-24: 10 mg via ORAL
  Filled 2017-02-23 (×2): qty 1

## 2017-02-23 MED ORDER — CALCIUM CARBONATE 1500 (600 CA) MG PO TABS
600.0000 mg | ORAL_TABLET | Freq: Every day | ORAL | Status: DC
  Filled 2017-02-23: qty 1

## 2017-02-23 SURGICAL SUPPLY — 124 items
BANDAGE ACE 3X5.8 VEL STRL LF (GAUZE/BANDAGES/DRESSINGS) ×4 IMPLANT
BANDAGE ACE 4X5 VEL STRL LF (GAUZE/BANDAGES/DRESSINGS) ×4 IMPLANT
BANDAGE ELASTIC 4 VELCRO ST LF (GAUZE/BANDAGES/DRESSINGS) ×4 IMPLANT
BENZOIN TINCTURE PRP APPL 2/3 (GAUZE/BANDAGES/DRESSINGS) ×4 IMPLANT
BIT DRILL 1.8 CANN MAX VPC (BIT) ×4 IMPLANT
BIT DRILL 2 FAST STEP (BIT) ×4 IMPLANT
BIT DRILL 2.5X2.75 QC CALB (BIT) ×4 IMPLANT
BIT DRILL 2.5X4 QC (BIT) ×4 IMPLANT
BIT DRILL 3.5X5.5 QC CALB (BIT) ×4 IMPLANT
BIT DRILL 3.8X127 SOLID (BIT) ×4 IMPLANT
BIT DRILL CALIBRATED 2.7 (BIT) ×3 IMPLANT
BIT DRILL CALIBRATED 2.7MM (BIT) ×1
BLADE AVERAGE 25MMX9MM (BLADE) ×1
BLADE AVERAGE 25X9 (BLADE) ×3 IMPLANT
BNDG ESMARK 4X9 LF (GAUZE/BANDAGES/DRESSINGS) ×8 IMPLANT
BNDG GAUZE ELAST 4 BULKY (GAUZE/BANDAGES/DRESSINGS) ×4 IMPLANT
BRUSH SCRUB SURG 4.25 DISP (MISCELLANEOUS) ×16 IMPLANT
CLOSURE WOUND 1/2 X4 (GAUZE/BANDAGES/DRESSINGS) ×1
CORDS BIPOLAR (ELECTRODE) ×4 IMPLANT
COVER SURGICAL LIGHT HANDLE (MISCELLANEOUS) ×12 IMPLANT
CUFF TOURNIQUET SINGLE 18IN (TOURNIQUET CUFF) ×8 IMPLANT
CUFF TOURNIQUET SINGLE 24IN (TOURNIQUET CUFF) IMPLANT
DRAIN PENROSE 1/4X12 LTX STRL (WOUND CARE) IMPLANT
DRAPE C-ARM 42X72 X-RAY (DRAPES) ×8 IMPLANT
DRAPE C-ARMOR (DRAPES) ×4 IMPLANT
DRAPE HALF SHEET 40X57 (DRAPES) ×4 IMPLANT
DRAPE INCISE IOBAN 66X45 STRL (DRAPES) ×4 IMPLANT
DRAPE OEC MINIVIEW 54X84 (DRAPES) ×4 IMPLANT
DRAPE ORTHO SPLIT 77X108 STRL (DRAPES)
DRAPE SURG 17X23 STRL (DRAPES) ×4 IMPLANT
DRAPE SURG ORHT 6 SPLT 77X108 (DRAPES) IMPLANT
DRAPE U-SHAPE 47X51 STRL (DRAPES) ×8 IMPLANT
DRSG ADAPTIC 3X8 NADH LF (GAUZE/BANDAGES/DRESSINGS) ×4 IMPLANT
DRSG PAD ABDOMINAL 8X10 ST (GAUZE/BANDAGES/DRESSINGS) ×4 IMPLANT
DURAPREP 26ML APPLICATOR (WOUND CARE) ×4 IMPLANT
ELECT REM PT RETURN 9FT ADLT (ELECTROSURGICAL) ×4
ELECTRODE REM PT RTRN 9FT ADLT (ELECTROSURGICAL) ×2 IMPLANT
EVACUATOR 1/8 PVC DRAIN (DRAIN) IMPLANT
GAUZE SPONGE 4X4 12PLY STRL (GAUZE/BANDAGES/DRESSINGS) ×4 IMPLANT
GAUZE XEROFORM 1X8 LF (GAUZE/BANDAGES/DRESSINGS) ×4 IMPLANT
GLOVE BIO SURGEON STRL SZ7.5 (GLOVE) ×4 IMPLANT
GLOVE BIOGEL PI IND STRL 7.5 (GLOVE) ×2 IMPLANT
GLOVE BIOGEL PI IND STRL 8 (GLOVE) ×2 IMPLANT
GLOVE BIOGEL PI INDICATOR 7.5 (GLOVE) ×2
GLOVE BIOGEL PI INDICATOR 8 (GLOVE) ×2
GLOVE SURG SYN 8.0 (GLOVE) ×4 IMPLANT
GOWN STRL REUS W/ TWL LRG LVL3 (GOWN DISPOSABLE) ×6 IMPLANT
GOWN STRL REUS W/ TWL XL LVL3 (GOWN DISPOSABLE) ×6 IMPLANT
GOWN STRL REUS W/TWL LRG LVL3 (GOWN DISPOSABLE) ×6
GOWN STRL REUS W/TWL XL LVL3 (GOWN DISPOSABLE) ×6
K-WIRE ACE 1.6X6 (WIRE) ×12
K-WIRE COCR 0.9X95 (WIRE) ×4
KIT BASIN OR (CUSTOM PROCEDURE TRAY) ×8 IMPLANT
KIT ROOM TURNOVER OR (KITS) ×8 IMPLANT
KWIRE ACE 1.6X6 (WIRE) ×6 IMPLANT
KWIRE COCR 0.9X95 (WIRE) ×2 IMPLANT
LOOP VESSEL MAXI BLUE (MISCELLANEOUS) ×4 IMPLANT
MANIFOLD NEPTUNE II (INSTRUMENTS) ×8 IMPLANT
NEEDLE HYPO 25GX1X1/2 BEV (NEEDLE) ×4 IMPLANT
NEEDLE HYPO 25X1 1.5 SAFETY (NEEDLE) ×4 IMPLANT
NS IRRIG 1000ML POUR BTL (IV SOLUTION) ×8 IMPLANT
PACK ORTHO EXTREMITY (CUSTOM PROCEDURE TRAY) ×8 IMPLANT
PAD ARMBOARD 7.5X6 YLW CONV (MISCELLANEOUS) ×16 IMPLANT
PAD CAST 4YDX4 CTTN HI CHSV (CAST SUPPLIES) ×4 IMPLANT
PADDING CAST COTTON 4X4 STRL (CAST SUPPLIES) ×4
PEG SUBCHONDRAL SMOOTH 2.0X20 (Peg) ×4 IMPLANT
PEG SUBCHONDRAL SMOOTH 2.0X22 (Peg) ×24 IMPLANT
PEG SUBCHONDRAL SMOOTH 2.0X24 (Peg) ×4 IMPLANT
PENCIL BUTTON HOLSTER BLD 10FT (ELECTRODE) ×4 IMPLANT
PLATE DIST HUM MEDIAL LT SM (Plate) ×4 IMPLANT
PLATE LOCK LT SM (Plate) ×4 IMPLANT
PLATE STAN 24.4X59.5 RT (Plate) ×4 IMPLANT
SCREW BN 12X3.5XNS CORT TI (Screw) ×2 IMPLANT
SCREW CORT 3.5X10 LNG (Screw) ×4 IMPLANT
SCREW CORT 3.5X12 (Screw) ×2 IMPLANT
SCREW CORT 3.5X14 LNG (Screw) ×4 IMPLANT
SCREW CORT 3.5X26 (Screw) ×2 IMPLANT
SCREW CORT T15 24X3.5XST LCK (Screw) ×4 IMPLANT
SCREW CORT T15 26X3.5XST LCK (Screw) ×2 IMPLANT
SCREW CORTICAL 3.5X24MM (Screw) ×4 IMPLANT
SCREW CORTICAL LOW PROF 3.5X20 (Screw) ×4 IMPLANT
SCREW LOCK 3.5X34 DIST TIB (Screw) ×4 IMPLANT
SCREW LOCK CORT STAR 3.5X14 (Screw) ×4 IMPLANT
SCREW LOCK CORT STAR 3.5X20 (Screw) ×4 IMPLANT
SCREW LOCK CORT STAR 3.5X28 (Screw) ×4 IMPLANT
SCREW LOCK CORT STAR 3.5X30 (Screw) ×4 IMPLANT
SCREW LOCK CORT STAR 3.5X34 (Screw) ×8 IMPLANT
SCREW LOW PROFILE 18MMX3.5MM (Screw) ×4 IMPLANT
SCREW LOW PROFILE 22MMX3.5MM (Screw) ×4 IMPLANT
SCREW NLOCK CORT STAR 4.5X46 (Screw) ×4 IMPLANT
SCREW VPC 2.5X22MM (Wire) ×2 IMPLANT
SPLINT FIBERGLASS 3X12 (CAST SUPPLIES) ×4 IMPLANT
SPONGE LAP 18X18 X RAY DECT (DISPOSABLE) IMPLANT
SPONGE LAP 4X18 X RAY DECT (DISPOSABLE) IMPLANT
STAPLER VISISTAT 35W (STAPLE) ×4 IMPLANT
STOCKINETTE IMPERVIOUS 9X36 MD (GAUZE/BANDAGES/DRESSINGS) IMPLANT
STRIP CLOSURE SKIN 1/2X4 (GAUZE/BANDAGES/DRESSINGS) ×3 IMPLANT
SUCTION FRAZIER HANDLE 10FR (MISCELLANEOUS) ×2
SUCTION TUBE FRAZIER 10FR DISP (MISCELLANEOUS) ×2 IMPLANT
SUT ETHILON 3 0 PS 1 (SUTURE) ×8 IMPLANT
SUT PROLENE 3 0 PS 1 (SUTURE) ×4 IMPLANT
SUT PROLENE 3 0 PS 2 (SUTURE) IMPLANT
SUT VIC AB 0 CT1 27 (SUTURE) ×6
SUT VIC AB 0 CT1 27XBRD ANBCTR (SUTURE) ×6 IMPLANT
SUT VIC AB 2-0 CT1 27 (SUTURE) ×6
SUT VIC AB 2-0 CT1 TAPERPNT 27 (SUTURE) ×6 IMPLANT
SUT VIC AB 2-0 FS1 27 (SUTURE) ×4 IMPLANT
SUT VIC AB 3-0 FS2 27 (SUTURE) IMPLANT
SUT VICRYL 4-0 PS2 18IN ABS (SUTURE) IMPLANT
SYR 5ML LL (SYRINGE) IMPLANT
SYR CONTROL 10ML LL (SYRINGE) ×4 IMPLANT
TAP BONE 110X40X6.5 CANC NS (Screw) ×2 IMPLANT
TAP BONE 197X6.5 CANCE NS (TAP) ×2 IMPLANT
TAP BONE 6.5 CANCELLOUS NS (Screw) ×2 IMPLANT
TAP BONE 6.5 CANCELLOUS NS (TAP) ×2
TOWEL OR 17X24 6PK STRL BLUE (TOWEL DISPOSABLE) ×8 IMPLANT
TOWEL OR 17X26 10 PK STRL BLUE (TOWEL DISPOSABLE) ×16 IMPLANT
TRAY FOLEY W/METER SILVER 16FR (SET/KITS/TRAYS/PACK) ×4 IMPLANT
UNDERPAD 30X30 (UNDERPADS AND DIAPERS) ×4 IMPLANT
VPC SCREW 2.5X22MM (Wire) ×4 IMPLANT
WASHER 3.5MM (Orthopedic Implant) ×8 IMPLANT
WASHER ACECAN 6.5 (Washer) ×4 IMPLANT
WATER STERILE IRR 1000ML POUR (IV SOLUTION) ×4 IMPLANT
YANKAUER SUCT BULB TIP NO VENT (SUCTIONS) IMPLANT

## 2017-02-23 NOTE — Transfer of Care (Signed)
Immediate Anesthesia Transfer of Care Note  Patient: Lori Deleon  Procedure(s) Performed: OPEN REDUCTION INTERNAL FIXATION (ORIF) DISTAL HUMERUS FRACTURE (Left ) OPEN REDUCTION INTERNAL FIXATION (ORIF) DISTAL RADIAL FRACTURE, right (Right )  Patient Location: PACU  Anesthesia Type:General  Level of Consciousness: awake, oriented and patient cooperative  Airway & Oxygen Therapy: Patient Spontanous Breathing and Patient connected to face mask oxygen  Post-op Assessment: Report given to RN and Post -op Vital signs reviewed and stable  Post vital signs: Reviewed  Last Vitals:  Vitals:   02/23/17 1127  BP: (!) 139/59  Pulse: 83  Resp: 16  Temp: 36.9 C  SpO2: 98%    Last Pain:  Vitals:   02/23/17 1127  TempSrc: Oral  PainSc:       Patients Stated Pain Goal: 3 (00/71/21 9758)  Complications: No apparent anesthesia complications

## 2017-02-23 NOTE — Anesthesia Procedure Notes (Signed)
Procedure Name: Intubation Date/Time: 02/23/2017 12:25 PM Performed by: Linna Caprice, DAVID Pre-anesthesia Checklist: Patient identified, Emergency Drugs available, Suction available and Patient being monitored Patient Re-evaluated:Patient Re-evaluated prior to induction Oxygen Delivery Method: Circle system utilized Preoxygenation: Pre-oxygenation with 100% oxygen Induction Type: IV induction Ventilation: Mask ventilation without difficulty Laryngoscope Size: Mac and 3 Grade View: Grade II Tube type: Oral Tube size: 7.0 mm Number of attempts: 1 Airway Equipment and Method: Stylet Placement Confirmation: ETT inserted through vocal cords under direct vision,  positive ETCO2 and breath sounds checked- equal and bilateral Secured at: 22 cm Tube secured with: Tape Dental Injury: Teeth and Oropharynx as per pre-operative assessment

## 2017-02-23 NOTE — H&P (Signed)
Orthopaedic Trauma Service (OTS) Consult   Patient ID: Lori Deleon MRN: [id number] DOB/AGE: 73/03/1946 73 y.o.  Reason for Consult: Distal humerus fracture Referring Physician: Dr. Charlotte Crumb, MD  HPI: Lori Deleon is an 71 y.o. female who is being seen in consultation at the request of Dr. Burney Gauze for evaluation of left distal humerus fracture.  The patient is a right-hand dominant female who fell through the attic last week and fell on concrete injuring both upper extremities.  She was admitted to the trauma service and orthopedics was consulted.  She was placed in splints on bilateral upper extremities.  She was discharged home and she had follow-up with Dr. Burney Gauze.  I was not initially consulted but Dr. Burney Gauze contact me nature of her distal humerus fracture and intra-articular extension.  I felt that proceeding with open reduction internal fixation would be most appropriate.  She had seen Dr. Burney Gauze in clinic yesterday and they discussed the risks and benefits to proceeding with surgery and she agreed to do it today.  She denies any numbness or tingling.  Denies any other injuries.  Overall she is relatively healthy.  She has a history of hypertension but denies any significant cardiac history.  Denies any nondiabetic history.  She is very independent and walks without any assist device.  She has been using her right upper extremity in a splint to perform ADLs.  She has not been able to move his left upper extremity due to the splint and pain.  Past Medical History:  Diagnosis Date  . Arthritis   . CIN II (cervical intraepithelial neoplasia II)   . Dyspnea    with exertion  . Esophageal reflux   . GERD (gastroesophageal reflux disease)   . Hyperglycemia   . Hypertension   . Intermittent chest pain   . Osteopenia   . UTI (lower urinary tract infection)     Past Surgical History:  Procedure Laterality Date  . ABDOMINAL HYSTERECTOMY    . BREAST BIOPSY Left 1987    neg    Family History  Problem Relation Age of Onset  . Breast cancer Neg Hx     Social History:  reports that she has never smoked. She has never used smokeless tobacco. She reports that she does not drink alcohol or use drugs.  Allergies:  Allergies  Allergen Reactions  . Sulfa Antibiotics Rash    Medications: I have reviewed the patient's current medications.  ROS: Constitutional: No fever or chills Vision: No changes in vision ENT: No difficulty swallowing CV: No chest pain Pulm: No SOB or wheezing GI: No nausea or vomiting GU: No urgency or inability to hold urine Skin: No poor wound healing Neurologic: No numbness or tingling Psychiatric: No depression or anxiety Heme: No bruising Allergic: No reaction to medications or food   Exam: There were no vitals taken for this visit. General: No acute distress Orientation: Awake alert and oriented x3 Mood and Affect: Cooperative and pleasant Gait: Not assessed Coordination and balance: Normal  Left upper extremity: Reveals a splint that is clean dry and intact.  Compartments of the forearm and upper arm are soft and compressible.  She has active motor and sensory function to the median, radial and ulnar nerve distribution.  She has a warm well-perfused hand with a brisk cap refill.  She has tenderness about the lateral clavicle.  She is able to passively elevate her arm to about 90 degrees but it is quite uncomfortable.  Skin around the  fracture was not able to be visualized.  She has normal reflexes and no lymphadenopathy.  Right upper extremity: Reveals skin without lesions.  Splint is clean dry and intact.  No instability about the elbow.  Full motor and sensory function to median, radial and ulnar nerve distribution.  Tenderness about the distal radius.  She is in a volar splint.  Warm well-perfused hand.   Medical Decision Making: Imaging: X-rays of the elbow were reviewed which shows a Y-type supracondylar humerus  fracture with intra-articular extension.  Shoulder x-rays show a lateral distal third clavicle fracture with mild displacement.  Right wrist x-rays show intra-articular distal radius fracture.  Labs: Hemoglobin 10.3 Cr 0.92  Medical history and chart was reviewed  Assessment/Plan: 71 year old right-hand dominant healthy female with a history of hypertension that presents with a left intra-articular distal humerus fracture as well as a right intra-articular distal radius fracture.  She also has a left distal third clavicle fracture.    I feel that her injuries on her left elbow would do best with open reduction internal fixation.  She will lose significant function of that arm if we were to not proceed with definitive fixation.  I discussed risks and benefits with the patient including risk of bleeding, infection, malunion, nonunion, stiffness, posttraumatic arthritis, ulnar nerve paresthesias and injury.  In light of these wrist the patient wishes to proceed with surgery.  I also discussed risks and benefits of her distal radius as Dr. Burney Gauze has already gone over.  She agrees to consent.  I believe that her left clavicle can be treated nonoperatively.    We will continue with a sling as needed for the left upper extremity.  Depending on whether or not I have to do a good her fixation is would determine whether or not we can advance her range of motion of that elbow.  I will plan to admit her overnight for observation unless she is doing well and desires to go home this evening.  Shona Needles, MD [facility] [phone] (phone)

## 2017-02-23 NOTE — Anesthesia Preprocedure Evaluation (Addendum)
Anesthesia Evaluation  Patient identified by MRN, date of birth, ID band Patient awake    Reviewed: Allergy & Precautions, NPO status , Patient's Chart, lab work & pertinent test results  Airway Mallampati: II  TM Distance: >3 FB Neck ROM: Full    Dental  (+) Teeth Intact, Dental Advisory Given   Pulmonary    breath sounds clear to auscultation       Cardiovascular hypertension, Pt. on medications  Rhythm:Regular Rate:Normal     Neuro/Psych    GI/Hepatic   Endo/Other    Renal/GU      Musculoskeletal   Abdominal   Peds  Hematology   Anesthesia Other Findings   Reproductive/Obstetrics                            Anesthesia Physical Anesthesia Plan  ASA: III  Anesthesia Plan: General   Post-op Pain Management:    Induction: Intravenous  PONV Risk Score and Plan: Ondansetron and Dexamethasone  Airway Management Planned: Oral ETT  Additional Equipment: CVP  Intra-op Plan:   Post-operative Plan: Extubation in OR  Informed Consent: I have reviewed the patients History and Physical, chart, labs and discussed the procedure including the risks, benefits and alternatives for the proposed anesthesia with the patient or authorized representative who has indicated his/her understanding and acceptance.   Dental advisory given  Plan Discussed with: CRNA and Anesthesiologist  Anesthesia Plan Comments:         Anesthesia Quick Evaluation

## 2017-02-23 NOTE — Anesthesia Postprocedure Evaluation (Signed)
Anesthesia Post Note  Patient: ALLEIGH MOLLICA  Procedure(s) Performed: OPEN REDUCTION INTERNAL FIXATION (ORIF) DISTAL HUMERUS FRACTURE (Left ) OPEN REDUCTION INTERNAL FIXATION (ORIF) DISTAL RADIAL FRACTURE, right (Right )     Patient location during evaluation: PACU Anesthesia Type: General Level of consciousness: awake, awake and alert and oriented Pain management: pain level controlled Vital Signs Assessment: post-procedure vital signs reviewed and stable Respiratory status: spontaneous breathing, nonlabored ventilation and respiratory function stable Cardiovascular status: blood pressure returned to baseline Postop Assessment: no headache Anesthetic complications: no    Last Vitals:  Vitals:   02/23/17 1856 02/23/17 1900  BP: (!) 180/75 (!) 186/75  Pulse: 95 97  Resp: 14 13  Temp:    SpO2: 96% 97%    Last Pain:  Vitals:   02/23/17 1830  TempSrc:   PainSc: 8                  Nancie Bocanegra COKER

## 2017-02-23 NOTE — Op Note (Signed)
Lori Deleon, Lori Deleon             ACCOUNT NO.:  [id number]  MEDICAL RECORD NO.:  [id number]  LOCATION:                               FACILITY:  [facility]  PHYSICIAN:  Sheral Apley. Vicky Mccanless, M.D.DATE OF BIRTH:  12-18-1945  DATE OF PROCEDURE:  02/23/2017 DATE OF DISCHARGE:  02/24/2017                              OPERATIVE REPORT   PREOPERATIVE DIAGNOSIS:  Displaced intra-articular fracture, 4-part distal radius, right side.  POSTOPERATIVE DIAGNOSIS:  Displaced intra-articular fracture, 4-part distal radius, right side.  DESCRIPTION OF PROCEDURE:  Open reduction and internal fixation, displaced intra-articular 4-part fracture, right side.  SURGEON:  Sheral Apley. Burney Gauze, MD.  ASSISTANT:  Marily Lente Dasnoit, PA.  ANESTHESIA:  General.  COMPLICATIONS:  No complication.  DRAINS:  No drains.  The patient was taken to the operating suite and after operative fixation of the left distal humerus by Dr. Lennette Bihari Haddix and by Dr. Charlotte Crumb assisting, we then re-prepped and draped the right upper extremity in the usual sterile fashion.  An Esmarch was used to exsanguinate the limb and tourniquet was then inflated to 250 mmHg.  At this point in time, an incision was made on the palmar aspect of the right distal forearm and wrist area.  Skin was incised sharply over the FCR tendon area.  FCR sheath was incised.  The FCR tendon was retracted to the medial side of the radial artery to the lateral side.  We incised the fascia and dissected down to the level of the pronator quadratus. We subperiosteally stripped the pronator quadratus off the distal radius volarly to expose intra-articular 4-part distal radius fracture.  We released the brachioradialis tendon off the radial styloid to ease in reduction.  Reduction was performed with flexion, ulnar deviation, and traction.  A standard DVR plate was placed on the volar aspect of the distal radius into the slotted hole.  A cortical screw was  placed.  We then used fluoroscopic imaging and direct visualization to determine adequate plate position.  The plate was fixed by 2 more cortical screws proximally followed by the smooth pegs distally.  The wound was thoroughly irrigated.  Fluoroscopic imaging revealed reduction of the fracture in all 3 views.  The wound was loosely closed in layers of 2-0 undyed Vicryl to cover the plate and 3-0 Prolene subcuticular stitch on the skin.  Steri-Strips, 4 x 4's, fluffs, and a volar splint was applied.  The patient tolerated this procedure well and went to recovery room in stable fashion.     Sheral Apley Burney Gauze, M.D.     MAW/MEDQ  D:  02/23/2017  T:  02/23/2017  Job:  [id number]

## 2017-02-23 NOTE — Op Note (Signed)
See note number [id number]

## 2017-02-23 NOTE — OR Nursing (Signed)
Pre-operative assessment done by [facility]

## 2017-02-23 NOTE — Op Note (Signed)
OrthopaedicSurgeryOperativeNote (WPY:[id number]) Date of Surgery: 02/23/2017  Admit Date: 02/23/2017   Diagnoses: Pre-Op Diagnoses: Left intra-articular distal humerus fracture Right intra-articular distal radius fracture Left distal third clavicle fracture  Post-Op Diagnosis: Same  Procedures: 1. CPT 24546-ORIF of left supracondylar humerus with intracondylar split 2. CPT 25360-Olecranon osteotomy 3. CPT 23500-Closed treatment of left clavicle fracture  Surgeons: Primary: Shona Needles, MD   Assistant: Dr. Charlotte Crumb, MD  [facility] OR ROOM 03   AnesthesiaGeneral   Antibiotics:Ancef 2g preop   Tourniquettime:* Missing tourniquet times found for documented tourniquets in log:  [id number] * Total Tourniquet Time Documented: Upper Arm (Left) - 119 minutes Total: Upper Arm (Left) - 119 minutes  Upper Arm (Right) - 31 minutes Total: Upper Arm (Right) - 31 minutes   [id number]:97 mL   Complications:None  Specimens:None  Implants:  Implant Name Type Inv. Item Serial No. Manufacturer Lot No. LRB No. Used Action  WASHER ACECAN 6.5 - [id number] Washer WASHER ACECAN 6.5  [employer]IMMER [employer]  Left 1 Implanted  SCREW CORTICAL 4.5X46MM - [id number] Screw SCREW CORTICAL 4.5X46MM  [employer]  Left 1 Implanted  PLATE LOCK LT SM - [id number] Plate PLATE LOCK LT SM  [employer]  Left 1 Implanted  PLATE DIST HUM MEDIAL LT SM - [id number] Plate PLATE DIST HUM MEDIAL LT SM  [employer]  Left 1 Implanted  WASHER 3.5MM - [id number] Orthopedic Implant WASHER 3.5MM  [employer]MER [employer]  Left 2 Implanted  SCREW MULTI DIR 3.5MM  34MM - [id number] Screw SCREW MULTI DIR 3.5MM  34MM  [employer]  Left 1 Implanted  VPC SCREW 2.5X22MM - [id number] Wire VPC SCREW 2.5X22MM  [employer]  Left 1 Implanted  SCREW LOW PROFILE 22MMX3.5MM - [id number] Screw SCREW LOW PROFILE 22MMX3.5MM  [employer]  Left 1 Implanted  SCREW CORTICAL LOW PROF 3.5X20 - [id number]  Screw SCREW CORTICAL LOW PROF 3.5X20  [employer]  Left 1 Implanted  SCREW LOW PROFILE 18MMX3.5MM - [id number] Screw SCREW LOW PROFILE 18MMX3.5MM  [employer]MER [employer]  Left 1 Implanted  SCREW CORT 3.5X26 - [id number] Screw SCREW CORT 3.5X26  [employer]  Left 1 Implanted  SCREW CORTICAL 3.5X24MM - [id number] Screw SCREW CORTICAL 3.5X24MM  [employer]MER [employer]  Left 2 Implanted  SCREW CORT LOCK 3.5MM 20MM - [id number] Screw SCREW CORT LOCK 3.5MM 20MM  [employer]MER [employer]  Left 1 Implanted  SCREW CORT LOCK 3.5MM 14MM - [id number] Screw SCREW CORT LOCK 3.5MM 14MM  [employer]MER [employer]  Left 1 Implanted  SCREW CORT LOCK 3.5MM 34MM - [id number] Screw SCREW CORT LOCK 3.5MM 34MM  [employer]MER [employer]  Left 2 Implanted  SCREW CORT LOCK 3.5MM 30MM - [id number] Screw SCREW CORT LOCK 3.5MM 30MM  [employer]MER [employer]  Left 1 Implanted  SCREW CORT LOCK 3.5MM 28MM - [id number] Screw SCREW CORT LOCK 3.5MM 28MM  [employer]MER [employer]IN[employer]  Left 1 Implanted  6.5 CANCELLOUS SCREW X 110    DEPUY TRAUMA  Left 1 Implanted  PLATE RIGHT DVRA R DBL TIER - [id number] Plate PLATE RIGHT DVRA R DBL TIER  [employer]MER [employer]  Right 1 Implanted  SCREW CORT 3.5X12 - [id number] Screw SCREW CORT 3.5X12  [employer]MER [employer]  Right 1 Implanted  SCREW CORT 3.5X14 - [id number] Screw SCREW CORT 3.5X14  ZIMMER [employer]IN[employer]  Right 1 Implanted  SCREW CORTICAL 3.5MMX10MM LONG - [id number] Screw SCREW CORTICAL 3.5MMX10MM LONG  [employer]IMMER [employer]OLIN[employer]  Right 1 Implanted  PEG SUBCHONDRAL SMOOTH 2.0X22 - [id number] Peg PEG SUBCHONDRAL SMOOTH 2.0X22  ZIMMER [employer]OLIN[employer]  Right 1 Implanted  PEG  SUBCHONDRAL SMOOTH 2.0X20 - [id number] Peg PEG SUBCHONDRAL SMOOTH 2.0X20  ZIMMER [employer]OLINAS  Right 6 Implanted  PEG SUBCHONDRAL SMOOTH 2.0X24 - [id number] Peg PEG SUBCHONDRAL SMOOTH 2.0X24   ZIMMER [employer]OLINAS   Right 1 Implanted    IndicationsforSurgery: [name] is a right-hand dominant female who fell through the attic last week and fell on concrete injuring both upper extremities.  She was admitted to  the trauma service and orthopedics was consulted.  She was placed in splints on bilateral upper extremities.  She was discharged home and she had follow-up with Dr. Burney Gauze.  I was not initially consulted but Dr. Burney Gauze contact me nature of her distal humerus fracture and intra-articular extension.   I feel that her injuries on her left elbow would do best with open reduction internal fixation.  She will lose significant function of that arm if we were to not proceed with definitive fixation.  I discussed risks and benefits with the patient including risk of bleeding, infection, malunion, nonunion, stiffness, posttraumatic arthritis, ulnar nerve paresthesias and injury. Risks and benefits were extensively discussed as noted above and the patient and their family agreed to proceed with surgery and consent was obtained.  Operative Findings: 1.  Comminuted intra-articular left distal humerus fracture with free trochlear fragment and small amount of comminution of the articular surface at the trochlear groove. 2.  Successful ORIF of distal humerus with posterior lateral and direct medial plating using the [employer] elbow plating system.  The free trochlear fragment was fixed with a 2.5 mm headless compression screw 3.  Olecranon osteotomy performed to visualize the articular surface and assist with reduction which was fixed with a solid 6.5 mm intramedullary screw with washer.  Procedure: The patient was identified in the preoperative holding area. Consent was confirmed with the patient and their family and all questions were answered. The operative extremity was marked after confirmation with the patient. They were then brought back to the operating room by our anesthesia colleagues. They were placed under general anesthesia and carefully transferred over to a radiolucent flat top table. They were then placed in the lateral decubitus position with the operative extremity up. A bean bag was used to secure  them in this position. An axillary roll was used to free the axilla from pressure. A radiolucent arm board was used to position the operative extremity. The splint was taken down and fluoroscopic films were taken to again characterize the injury pattern. The arm was then prepped and draped in usual sterile fashion. A sterile tourniquet was placed to the upper arm. A timeout was performed to verify the patient, the procedure and the extremity. Preoperative antibiotics were dosed. An esmarch was used to exsangunate the arm and the tourniquet was inflated to 269mmHg.  A standard posterior approach to the distal humerus was made. Subcutaneous fat and triceps fascia was incised along the incision. Medial and lateral skin flaps were mobilized to exposed the entirety of the triceps. Medially the ulnar nerve was dissected out and isolated. A branch to the joint was sacrificed as it was in the way of the reduction. The nerve was mobilized out of the cubital tunnel, all the way to the heads of the FCU. A subperisoteal dissection was carried out underneath the triceps to expose the fracture. The lateral side was exposed in a similar fashion and extended all the way intra-articular until the capitellum was visualized. A subperisoteal dissection was carried out underneath the triceps to expose the fracture on the lateral  side as well.   There was significant intra-articular involvement with a free trochlear fragment as well as some mild comminution at the trochlear groove.  I felt that it would be most appropriate to perform a olecranon osteotomy to visualize the joint surface for the best reduction.  A 3.8 mm drill was used to drill the intramedullary canal.  And then a 6.5 mm tap was used to tap the length of the ulnar shaft all the way into the isthmus of the ulna. A chevron osteotomy was then made with a small oscillating saw.  It was finished with an osteotome.  The soft tissue attachments were then cut and the  osteotomy and triceps were flipped proximal.  The osteotomy fragment was covered with a damp lap for the procedure.  Other than the trochlear fragment it was a relatively simple split with good cortical reads at the medial and lateral fractures.  The medial condyle was manipulated and reduced back to the shaft.  A pointed reduction tenaculum was then used to hold the reduction of the condyle to the shaft.  A 1.6 mm K wire was used from the medial condyle into the humeral shaft to provisionally hold this in place.  Using the olecranon as a reduction aid the lateral condyle was then manipulated with a 1.6 mm joystick K wire.  The condyle was rotated and reduced.  A small pointed reduction clamp was used to provisionally hold the lateral condyle reduced to the humeral shaft.  A 1.6 mm K wire was placed from the lateral condyle into the humeral shaft provisionally hold this reduction as well.  A large pointed reduction tenaculum was then used to compress and reduce the lateral and medial articular surfaces together.  The free articular fragment was then reduced anatomically and held provisionally with a 1.1 mm K wire.  A 4.5 mm lag screw was then placed across the articular block.  Excellent fixation was obtained.  The reduction tenaculum was then removed. A posterolateral plate was contoured to fit the distal humerus. It was pinned in place and fluoro was used to confirm correct positioning. A nonlocking 3.81mm screw was used to fix it to the shaft. Distal locking screws were placed in the distal fragment directed into the capitellum. Three locking screws were placed in the distal fragment. The remainder of the proximal screws were nonlocking and 3.84mm in size. A medial plate was contoured and pinned in place over the medial condyle. Fluoro was used to confirm correct positioning. A nonlocking 3.51mm screw was placed in the humeral shaft of the proximal segment. The fast guides were then used to direct three locking  screws in the screws into the articular block of the distal segment.  Fluoroscopic images were used to confirm adequate length of the screws and that we had no intra-articular screws in place.  The osteotomy fragment was then reduced and held in place while a solid 6.5 millimeter screw was placed intramedullary with a washer in excellent compression was had stability of the construct.  Final fluoro images were obtained which showed adequate reduction and length of all screws. A range of motion was performed under fluoroscopy which showed no instability and good range of motion. A gram of vancomycin powder and 1.2 grams of tobramycin powder was placed in the incision. The fascia was closed with 0 vicryl. The skin was closed with 2-0 vicryl, 3-0 nylon. Bacitracin ointment, adaptic, 4x4s and sterile cast padding was place and the arm was wrapped with an  ACE wrap. The patient was placed supine and the patient was transferred to Dr. Burney Gauze for the distal radius ORIF.  Post Op Plan/Instructions: The patient will be nonweightbearing to the left upper extremity.  She will have unrestricted range of motion.  We will have her take down the dressing on postoperative day 3.  She received postoperative Ancef.  DVT prophylaxis not needed in this upper extremity ambulatory patient.  She will be admitted overnight for observation and go home likely postoperative day 1.  I was present and performed the entire surgery.  Katha Hamming, MD [facility]

## 2017-02-23 NOTE — Op Note (Deleted)
  The note originally documented on this encounter has been moved the the encounter in which it belongs.  

## 2017-02-23 NOTE — Anesthesia Procedure Notes (Signed)
Central Venous Catheter Insertion Performed by: Roberts Gaudy, anesthesiologist Start/End10/26/2018 12:30 PM, 02/23/2017 12:35 PM Patient location: OR. Preanesthetic checklist: patient identified, IV checked, site marked, risks and benefits discussed, surgical consent, monitors and equipment checked, pre-op evaluation and timeout performed Position: supine Hand hygiene performed , maximum sterile barriers used  and Seldinger technique used Catheter size: 8 Fr Central line was placed.Double lumen Procedure performed without using ultrasound guided technique. Ultrasound Notes:anatomy identified, needle tip was noted to be adjacent to the nerve/plexus identified and no ultrasound evidence of intravascular and/or intraneural injection Attempts: 1 Following insertion, line sutured, dressing applied and Biopatch. Post procedure assessment: blood return through all ports, free fluid flow and no air  Patient tolerated the procedure well with no immediate complications.

## 2017-02-24 DIAGNOSIS — K219 Gastro-esophageal reflux disease without esophagitis: Secondary | ICD-10-CM | POA: Diagnosis not present

## 2017-02-24 DIAGNOSIS — Z79899 Other long term (current) drug therapy: Secondary | ICD-10-CM | POA: Diagnosis not present

## 2017-02-24 DIAGNOSIS — I10 Essential (primary) hypertension: Secondary | ICD-10-CM | POA: Diagnosis not present

## 2017-02-24 DIAGNOSIS — S42032A Displaced fracture of lateral end of left clavicle, initial encounter for closed fracture: Secondary | ICD-10-CM | POA: Diagnosis not present

## 2017-02-24 DIAGNOSIS — S52571A Other intraarticular fracture of lower end of right radius, initial encounter for closed fracture: Secondary | ICD-10-CM | POA: Diagnosis not present

## 2017-02-24 DIAGNOSIS — S42422A Displaced comminuted supracondylar fracture without intercondylar fracture of left humerus, initial encounter for closed fracture: Secondary | ICD-10-CM | POA: Diagnosis not present

## 2017-02-24 DIAGNOSIS — M8580 Other specified disorders of bone density and structure, unspecified site: Secondary | ICD-10-CM | POA: Diagnosis not present

## 2017-02-24 LAB — CBC
HEMATOCRIT: 31.1 % — AB (ref 36.0–46.0)
HEMOGLOBIN: 10.4 g/dL — AB (ref 12.0–15.0)
MCH: 29.1 pg (ref 26.0–34.0)
MCHC: 33.4 g/dL (ref 30.0–36.0)
MCV: 87.1 fL (ref 78.0–100.0)
Platelets: 138 10*3/uL — ABNORMAL LOW (ref 150–400)
RBC: 3.57 MIL/uL — AB (ref 3.87–5.11)
RDW: 17.1 % — ABNORMAL HIGH (ref 11.5–15.5)
WBC: 9.2 10*3/uL (ref 4.0–10.5)

## 2017-02-24 MED ORDER — SODIUM CHLORIDE 0.9 % IV SOLN
INTRAVENOUS | Status: DC
  Administered 2017-02-24: 05:00:00 via INTRAVENOUS

## 2017-02-24 MED ORDER — OXYCODONE HCL 5 MG PO TABS: 5.0000 mg | ORAL_TABLET | Freq: Four times a day (QID) | ORAL | 0 refills | Status: AC | PRN

## 2017-02-24 NOTE — Discharge Instructions (Addendum)
[facility] Discharge Instructions   General Discharge Instructions  WEIGHT BEARING STATUS: Nonweightbearing bilateral upper extremities  RANGE OF MOTION/ACTIVITY: Range of motion as tolerated left arm in elbow and wrist. Range of motion as tolerated in the right elbow.  Wound Care: Keep splint in place on RIGHT had and arm until you return to see Dr. Burney Gauze in clinic. You may remove the left arm dressing on Monday and follow the wound care instructions below  DVT/PE prophylaxis: Take a baby (81mg ) aspirin every day  Diet: as you were eating previously.  Can use over the counter stool softeners and bowel preparations, such as Miralax, to help with bowel movements.  Narcotics can be constipating.  Be sure to drink plenty of fluids  PAIN MEDICATION USE AND EXPECTATIONS  You have likely been given narcotic medications to help control your pain.  After a traumatic event that results in an fracture (broken bone) with or without surgery, it is ok to use narcotic pain medications to help control one's pain.  We understand that everyone responds to pain differently and each individual patient will be evaluated on a regular basis for the continued need for narcotic medications. Ideally, narcotic medication use should last no more than 6-8 weeks (coinciding with fracture healing).   As a patient it is your responsibility as well to monitor narcotic medication use and report the amount and frequency you use these medications when you come to your office visit.   We would also advise that if you are using narcotic medications, you should take a dose prior to therapy to maximize you participation.  IF YOU ARE ON NARCOTIC MEDICATIONS IT IS NOT PERMISSIBLE TO OPERATE A MOTOR VEHICLE (MOTORCYCLE/CAR/TRUCK/MOPED) OR HEAVY MACHINERY DO NOT MIX NARCOTICS WITH OTHER CNS (CENTRAL NERVOUS SYSTEM) DEPRESSANTS SUCH AS ALCOHOL  STOP SMOKING OR USING NICOTINE PRODUCTS!!!!  As discussed nicotine  severely impairs your body's ability to heal surgical and traumatic wounds but also impairs bone healing.  Wounds and bone heal by forming microscopic blood vessels (angiogenesis) and nicotine is a vasoconstrictor (essentially, shrinks blood vessels).  Therefore, if vasoconstriction occurs to these microscopic blood vessels they essentially disappear and are unable to deliver necessary nutrients to the healing tissue.  This is one modifiable factor that you can do to dramatically increase your chances of healing your injury.    (This means no smoking, no nicotine gum, patches, etc)  DO NOT USE NONSTEROIDAL ANTI-INFLAMMATORY DRUGS (NSAID'S)  Using products such as Advil (ibuprofen), Aleve (naproxen), Motrin (ibuprofen) for additional pain control during fracture healing can delay and/or prevent the healing response.  If you would like to take over the counter (OTC) medication, Tylenol (acetaminophen) is ok.  However, some narcotic medications that are given for pain control contain acetaminophen as well. Therefore, you should not exceed more than 4000 mg of tylenol in a day if you do not have liver disease.  Also note that there are may OTC medicines, such as cold medicines and allergy medicines that my contain tylenol as well.  If you have any questions about medications and/or interactions please ask your doctor/PA or your pharmacist.     ICE AND ELEVATE INJURED/OPERATIVE EXTREMITY  Using ice and elevating the injured extremity above your heart can help with swelling and pain control.  Icing in a pulsatile fashion, such as 20 minutes on and 20 minutes off, can be followed.    Do not place ice directly on skin. Make sure there is a barrier between to  skin and the ice pack.    Using frozen items such as frozen peas works well as the conform nicely to the are that needs to be iced.  USE AN ACE WRAP OR TED HOSE FOR SWELLING CONTROL  In addition to icing and elevation, Ace wraps or TED hose are used to help  limit and resolve swelling.  It is recommended to use Ace wraps or TED hose until you are informed to stop.    When using Ace Wraps start the wrapping distally (farthest away from the body) and wrap proximally (closer to the body)   Example: If you had surgery on your leg or thing and you do not have a splint on, start the ace wrap at the toes and work your way up to the thigh        If you had surgery on your upper extremity and do not have a splint on, start the ace wrap at your fingers and work your way up to the upper arm  IF YOU ARE IN A SPLINT OR CAST DO NOT [place]   If your splint gets wet for any reason please contact the office immediately. You may shower in your splint or cast as long as you keep it dry.  This can be done by wrapping in a cast cover or garbage back (or similar)  Do Not stick any thing down your splint or cast such as pencils, money, or hangers to try and scratch yourself with.  If you feel itchy take benadryl as prescribed on the bottle for itching  CALL THE OFFICE WITH ANY QUESTIONS OR CONCERNS: [phone]    Discharge Wound Care Instructions  Do NOT apply any ointments, solutions or lotions to pin sites or surgical wounds.  These prevent needed drainage and even though solutions like hydrogen peroxide kill bacteria, they also damage cells lining the pin sites that help fight infection.  Applying lotions or ointments can keep the wounds moist and can cause them to breakdown and open up as well. This can increase the risk for infection. When in doubt call the office.  Surgical incisions should be dressed daily.  If any drainage is noted, use one layer of adaptic, then gauze, Kerlix, and an ace wrap.  Once the incision is completely dry and without drainage, it may be left open to air out.  Showering may begin 36-48 hours later.  Cleaning gently with soap and water.

## 2017-02-24 NOTE — Progress Notes (Signed)
Orthopaedic Trauma Progress Note  S: Doing well overnight. Left elbow hurts but has no significant complaints this AM. Not needed IV meds overnight  O:  Vitals:   02/23/17 2230 02/24/17 0535  BP: (!) 172/95 (!) 184/90  Pulse: (!) 103 94  Resp: 18   Temp: 98.8 F (37.1 C) 99 F (37.2 C)  SpO2: 100% 99%  Gen: No acute distress Awake, alert and oriented x3 Cooperative and pleasant  LUE: Dressing clean, dry and intact. Elbow ROM limited due to pain. Active median, radial and ulnar nerve motor function and sensory function intact. Warm and well perfused fingers with brisk cap refill.  RUE: Splint clean, dry and intact. Active median, radial and ulnar nerve motor function and sensory function intact. Warm and well perfused fingers with brisk cap refill.  Labs: Hemoglobin 75.56  A/P: 71 year old female s/p ORIF left distal humerus and ORIF of right distal radius  -NWB BUE -Plan for discharge today -Return to clinic in 2 weeks -Dressing instructions in discharge instructions  Shona Needles, MD [facility] [phone] (phone)

## 2017-02-24 NOTE — Discharge Summary (Signed)
Orthopaedic Trauma Service (OTS)  Patient ID: MERRI DIMAANO MRN: [id number] DOB/AGE: 1945-06-20 71 y.o.  Admit date: 02/23/2017 Discharge date: 02/24/2017  Admission Diagnoses: Left supracondylar humerus fracture Right distal radius fracture Left clavicle fracture  Discharge Diagnoses:  Same as above  Procedures Performed: ORIF left supracondylar humerus fracture by Dr. Doreatha Martin ORIF of right distal radius fracture by Dr. Burney Gauze  Discharged Condition: good  Hospital Course: The patient was admitted overnight for pain control and observation. She did well. Pain was well controlled and she was tolerating oral pain medications. She was voiding spontaneously and tolerating a regular diet.  Consults: None  Significant Diagnostic Studies: None  Treatments: surgery: as above  Discharge Exam: Gen: No acute distress Awake, alert and oriented x3 Cooperative and pleasant  LUE: Dressing clean, dry and intact. Elbow ROM limited due to pain. Active median, radial and ulnar nerve motor function and sensory function intact. Warm and well perfused fingers with brisk cap refill.  RUE: Splint clean, dry and intact. Active median, radial and ulnar nerve motor function and sensory function intact. Warm and well perfused fingers with brisk cap refill.  Disposition: 01-Home or Self Care   Allergies as of 02/24/2017      Reactions   Sulfa Antibiotics Rash      Medication List    STOP taking these medications   acetaminophen 500 MG tablet Commonly known as:  TYLENOL     TAKE these medications   amLODipine 10 MG tablet Commonly known as:  NORVASC Take 1 tablet by mouth daily.   calcium carbonate 600 MG Tabs tablet Commonly known as:  OS-CAL Take 600 mg by mouth daily with breakfast.   carboxymethylcellulose 0.5 % Soln Commonly known as:  REFRESH PLUS 1 drop daily as needed.   CORICIDIN HBP COUGH/COLD PO Take 1 tablet by mouth every 12 (twelve) hours as needed.    dexlansoprazole 60 MG capsule Commonly known as:  DEXILANT Take 60 mg by mouth daily.   hydroxypropyl methylcellulose / hypromellose 2.5 % ophthalmic solution Commonly known as:  ISOPTO TEARS / GONIOVISC Place 2 drops into both eyes as needed for dry eyes.   ketotifen 0.025 % ophthalmic solution Commonly known as:  ZADITOR Place 1 drop into both eyes daily.   losartan 50 MG tablet Commonly known as:  COZAAR Take 50 mg by mouth daily.   multivitamin tablet Take 1 tablet by mouth daily.   omega-3 fish oil 1000 MG Caps capsule Commonly known as:  MAXEPA Take 1 capsule by mouth 2 (two) times daily.   oxyCODONE 5 MG immediate release tablet Commonly known as:  Oxy IR/ROXICODONE Take 1-2 tablets (5-10 mg total) by mouth every 6 (six) hours as needed for moderate pain. What changed:  how much to take  when to take this   ranitidine 150 MG tablet Commonly known as:  ZANTAC Take 300 mg by mouth at bedtime.   VICKS VAPORUB 4.7-1.2-2.6 % Oint Apply 1 application topically daily as needed (COUGH).   vitamin C 500 MG tablet Commonly known as:  ASCORBIC ACID Take 500 mg by mouth daily.   Vitamin D-3 1000 units Caps Take 1 capsule by mouth daily.      Follow-up Information    Lillyrose Reitan, Thomasene Lot, MD. Schedule an appointment as soon as possible for a visit in 2 week(s).   Specialty:  Orthopedic Surgery Contact information: [address] [place] [address] [phone]        Charlotte Crumb, MD.  Schedule an appointment as soon as possible for a visit in 2 week(s).   Specialty:  Orthopedic Surgery Contact information: [place] [address] [phone]           Discharge Instructions and Plan: Return to clinic in 2 weeks for x-rays and suture removal with Dr. Doreatha Martin and Dr. Burney Gauze.  Signed: Shona Needles, MD [facility] [phone] (phone)  02/24/2017, 8:26 AM

## 2017-02-24 NOTE — Progress Notes (Signed)
Patient transported to Entrance via [facility] with RN.  Son taking pt home.  BP 121/75, HR 93.  Pajama bottoms on, upper arm in bilateral slings.  Patient comfortable, given DC instructions with son, aware of pain management, constipation management, and F/U appt with Sx on Tuesday 02/27/17.

## 2017-02-25 ENCOUNTER — Encounter: Payer: Self-pay | Admitting: Student

## 2017-02-25 DIAGNOSIS — W1789XA Other fall from one level to another, initial encounter: Secondary | ICD-10-CM | POA: Insufficient documentation

## 2017-02-26 ENCOUNTER — Encounter (HOSPITAL_COMMUNITY): Payer: Self-pay | Admitting: Student

## 2017-02-26 DIAGNOSIS — S42402A Unspecified fracture of lower end of left humerus, initial encounter for closed fracture: Secondary | ICD-10-CM | POA: Diagnosis not present

## 2017-02-26 DIAGNOSIS — S52501A Unspecified fracture of the lower end of right radius, initial encounter for closed fracture: Secondary | ICD-10-CM | POA: Diagnosis not present

## 2017-02-26 DIAGNOSIS — S42001A Fracture of unspecified part of right clavicle, initial encounter for closed fracture: Secondary | ICD-10-CM | POA: Diagnosis not present

## 2017-02-26 DIAGNOSIS — S42002A Fracture of unspecified part of left clavicle, initial encounter for closed fracture: Secondary | ICD-10-CM | POA: Diagnosis not present

## 2017-02-27 DIAGNOSIS — S42402A Unspecified fracture of lower end of left humerus, initial encounter for closed fracture: Secondary | ICD-10-CM | POA: Diagnosis not present

## 2017-02-27 DIAGNOSIS — S42002A Fracture of unspecified part of left clavicle, initial encounter for closed fracture: Secondary | ICD-10-CM | POA: Diagnosis not present

## 2017-02-27 DIAGNOSIS — S52501A Unspecified fracture of the lower end of right radius, initial encounter for closed fracture: Secondary | ICD-10-CM | POA: Diagnosis not present

## 2017-02-27 DIAGNOSIS — S42001A Fracture of unspecified part of right clavicle, initial encounter for closed fracture: Secondary | ICD-10-CM | POA: Diagnosis not present

## 2017-02-28 DIAGNOSIS — S42002A Fracture of unspecified part of left clavicle, initial encounter for closed fracture: Secondary | ICD-10-CM | POA: Diagnosis not present

## 2017-02-28 DIAGNOSIS — S42001A Fracture of unspecified part of right clavicle, initial encounter for closed fracture: Secondary | ICD-10-CM | POA: Diagnosis not present

## 2017-02-28 DIAGNOSIS — S42402A Unspecified fracture of lower end of left humerus, initial encounter for closed fracture: Secondary | ICD-10-CM | POA: Diagnosis not present

## 2017-02-28 DIAGNOSIS — S52501A Unspecified fracture of the lower end of right radius, initial encounter for closed fracture: Secondary | ICD-10-CM | POA: Diagnosis not present

## 2017-03-01 DIAGNOSIS — S52501A Unspecified fracture of the lower end of right radius, initial encounter for closed fracture: Secondary | ICD-10-CM | POA: Diagnosis not present

## 2017-03-01 DIAGNOSIS — S52571A Other intraarticular fracture of lower end of right radius, initial encounter for closed fracture: Secondary | ICD-10-CM | POA: Diagnosis not present

## 2017-03-01 DIAGNOSIS — S42402A Unspecified fracture of lower end of left humerus, initial encounter for closed fracture: Secondary | ICD-10-CM | POA: Diagnosis not present

## 2017-03-01 DIAGNOSIS — S42412D Displaced simple supracondylar fracture without intercondylar fracture of left humerus, subsequent encounter for fracture with routine healing: Secondary | ICD-10-CM | POA: Diagnosis not present

## 2017-03-01 DIAGNOSIS — S52571D Other intraarticular fracture of lower end of right radius, subsequent encounter for closed fracture with routine healing: Secondary | ICD-10-CM | POA: Diagnosis not present

## 2017-03-01 DIAGNOSIS — S42002A Fracture of unspecified part of left clavicle, initial encounter for closed fracture: Secondary | ICD-10-CM | POA: Diagnosis not present

## 2017-03-01 DIAGNOSIS — S42001A Fracture of unspecified part of right clavicle, initial encounter for closed fracture: Secondary | ICD-10-CM | POA: Diagnosis not present

## 2017-03-02 DIAGNOSIS — S42002A Fracture of unspecified part of left clavicle, initial encounter for closed fracture: Secondary | ICD-10-CM | POA: Diagnosis not present

## 2017-03-02 DIAGNOSIS — S42402A Unspecified fracture of lower end of left humerus, initial encounter for closed fracture: Secondary | ICD-10-CM | POA: Diagnosis not present

## 2017-03-02 DIAGNOSIS — S52501A Unspecified fracture of the lower end of right radius, initial encounter for closed fracture: Secondary | ICD-10-CM | POA: Diagnosis not present

## 2017-03-02 DIAGNOSIS — S42001A Fracture of unspecified part of right clavicle, initial encounter for closed fracture: Secondary | ICD-10-CM | POA: Diagnosis not present

## 2017-03-03 DIAGNOSIS — S42002A Fracture of unspecified part of left clavicle, initial encounter for closed fracture: Secondary | ICD-10-CM | POA: Diagnosis not present

## 2017-03-03 DIAGNOSIS — S42402A Unspecified fracture of lower end of left humerus, initial encounter for closed fracture: Secondary | ICD-10-CM | POA: Diagnosis not present

## 2017-03-03 DIAGNOSIS — S42001A Fracture of unspecified part of right clavicle, initial encounter for closed fracture: Secondary | ICD-10-CM | POA: Diagnosis not present

## 2017-03-03 DIAGNOSIS — S52501A Unspecified fracture of the lower end of right radius, initial encounter for closed fracture: Secondary | ICD-10-CM | POA: Diagnosis not present

## 2017-03-05 DIAGNOSIS — S42002A Fracture of unspecified part of left clavicle, initial encounter for closed fracture: Secondary | ICD-10-CM | POA: Diagnosis not present

## 2017-03-05 DIAGNOSIS — S52501A Unspecified fracture of the lower end of right radius, initial encounter for closed fracture: Secondary | ICD-10-CM | POA: Diagnosis not present

## 2017-03-05 DIAGNOSIS — S42001A Fracture of unspecified part of right clavicle, initial encounter for closed fracture: Secondary | ICD-10-CM | POA: Diagnosis not present

## 2017-03-05 DIAGNOSIS — S42402A Unspecified fracture of lower end of left humerus, initial encounter for closed fracture: Secondary | ICD-10-CM | POA: Diagnosis not present

## 2017-03-06 DIAGNOSIS — S42001A Fracture of unspecified part of right clavicle, initial encounter for closed fracture: Secondary | ICD-10-CM | POA: Diagnosis not present

## 2017-03-06 DIAGNOSIS — S42002A Fracture of unspecified part of left clavicle, initial encounter for closed fracture: Secondary | ICD-10-CM | POA: Diagnosis not present

## 2017-03-06 DIAGNOSIS — S52501A Unspecified fracture of the lower end of right radius, initial encounter for closed fracture: Secondary | ICD-10-CM | POA: Diagnosis not present

## 2017-03-06 DIAGNOSIS — S42402A Unspecified fracture of lower end of left humerus, initial encounter for closed fracture: Secondary | ICD-10-CM | POA: Diagnosis not present

## 2017-03-07 DIAGNOSIS — S42002A Fracture of unspecified part of left clavicle, initial encounter for closed fracture: Secondary | ICD-10-CM | POA: Diagnosis not present

## 2017-03-07 DIAGNOSIS — S42402A Unspecified fracture of lower end of left humerus, initial encounter for closed fracture: Secondary | ICD-10-CM | POA: Diagnosis not present

## 2017-03-07 DIAGNOSIS — S42001A Fracture of unspecified part of right clavicle, initial encounter for closed fracture: Secondary | ICD-10-CM | POA: Diagnosis not present

## 2017-03-07 DIAGNOSIS — S52501A Unspecified fracture of the lower end of right radius, initial encounter for closed fracture: Secondary | ICD-10-CM | POA: Diagnosis not present

## 2017-03-08 DIAGNOSIS — S42001A Fracture of unspecified part of right clavicle, initial encounter for closed fracture: Secondary | ICD-10-CM | POA: Diagnosis not present

## 2017-03-08 DIAGNOSIS — S42002A Fracture of unspecified part of left clavicle, initial encounter for closed fracture: Secondary | ICD-10-CM | POA: Diagnosis not present

## 2017-03-08 DIAGNOSIS — S52501A Unspecified fracture of the lower end of right radius, initial encounter for closed fracture: Secondary | ICD-10-CM | POA: Diagnosis not present

## 2017-03-08 DIAGNOSIS — S42402A Unspecified fracture of lower end of left humerus, initial encounter for closed fracture: Secondary | ICD-10-CM | POA: Diagnosis not present

## 2017-03-09 DIAGNOSIS — S42002A Fracture of unspecified part of left clavicle, initial encounter for closed fracture: Secondary | ICD-10-CM | POA: Diagnosis not present

## 2017-03-09 DIAGNOSIS — S52501A Unspecified fracture of the lower end of right radius, initial encounter for closed fracture: Secondary | ICD-10-CM | POA: Diagnosis not present

## 2017-03-09 DIAGNOSIS — S42001A Fracture of unspecified part of right clavicle, initial encounter for closed fracture: Secondary | ICD-10-CM | POA: Diagnosis not present

## 2017-03-09 DIAGNOSIS — S42402A Unspecified fracture of lower end of left humerus, initial encounter for closed fracture: Secondary | ICD-10-CM | POA: Diagnosis not present

## 2017-03-10 DIAGNOSIS — S52501A Unspecified fracture of the lower end of right radius, initial encounter for closed fracture: Secondary | ICD-10-CM | POA: Diagnosis not present

## 2017-03-10 DIAGNOSIS — S42402A Unspecified fracture of lower end of left humerus, initial encounter for closed fracture: Secondary | ICD-10-CM | POA: Diagnosis not present

## 2017-03-10 DIAGNOSIS — S42002A Fracture of unspecified part of left clavicle, initial encounter for closed fracture: Secondary | ICD-10-CM | POA: Diagnosis not present

## 2017-03-10 DIAGNOSIS — S42001A Fracture of unspecified part of right clavicle, initial encounter for closed fracture: Secondary | ICD-10-CM | POA: Diagnosis not present

## 2017-03-13 DIAGNOSIS — S42492D Other displaced fracture of lower end of left humerus, subsequent encounter for fracture with routine healing: Secondary | ICD-10-CM | POA: Diagnosis not present

## 2017-03-26 DIAGNOSIS — R05 Cough: Secondary | ICD-10-CM | POA: Diagnosis not present

## 2017-03-26 DIAGNOSIS — W19XXXA Unspecified fall, initial encounter: Secondary | ICD-10-CM | POA: Diagnosis not present

## 2017-03-26 DIAGNOSIS — Z23 Encounter for immunization: Secondary | ICD-10-CM | POA: Diagnosis not present

## 2017-03-26 DIAGNOSIS — J029 Acute pharyngitis, unspecified: Secondary | ICD-10-CM | POA: Diagnosis not present

## 2017-03-27 DIAGNOSIS — S42492D Other displaced fracture of lower end of left humerus, subsequent encounter for fracture with routine healing: Secondary | ICD-10-CM | POA: Diagnosis not present

## 2017-03-27 DIAGNOSIS — S52571D Other intraarticular fracture of lower end of right radius, subsequent encounter for closed fracture with routine healing: Secondary | ICD-10-CM | POA: Diagnosis not present

## 2017-03-27 DIAGNOSIS — S42002D Fracture of unspecified part of left clavicle, subsequent encounter for fracture with routine healing: Secondary | ICD-10-CM | POA: Diagnosis not present

## 2017-03-30 DIAGNOSIS — M25541 Pain in joints of right hand: Secondary | ICD-10-CM | POA: Diagnosis not present

## 2017-03-30 DIAGNOSIS — M25542 Pain in joints of left hand: Secondary | ICD-10-CM | POA: Diagnosis not present

## 2017-03-30 DIAGNOSIS — M25422 Effusion, left elbow: Secondary | ICD-10-CM | POA: Diagnosis not present

## 2017-03-30 DIAGNOSIS — M25631 Stiffness of right wrist, not elsewhere classified: Secondary | ICD-10-CM | POA: Diagnosis not present

## 2017-03-30 DIAGNOSIS — M25622 Stiffness of left elbow, not elsewhere classified: Secondary | ICD-10-CM | POA: Diagnosis not present

## 2017-03-30 DIAGNOSIS — M25522 Pain in left elbow: Secondary | ICD-10-CM | POA: Diagnosis not present

## 2017-03-31 DIAGNOSIS — M25542 Pain in joints of left hand: Secondary | ICD-10-CM | POA: Diagnosis not present

## 2017-03-31 DIAGNOSIS — M25541 Pain in joints of right hand: Secondary | ICD-10-CM | POA: Diagnosis not present

## 2017-03-31 DIAGNOSIS — M25422 Effusion, left elbow: Secondary | ICD-10-CM | POA: Diagnosis not present

## 2017-03-31 DIAGNOSIS — M25522 Pain in left elbow: Secondary | ICD-10-CM | POA: Diagnosis not present

## 2017-03-31 DIAGNOSIS — M25622 Stiffness of left elbow, not elsewhere classified: Secondary | ICD-10-CM | POA: Diagnosis not present

## 2017-03-31 DIAGNOSIS — M25631 Stiffness of right wrist, not elsewhere classified: Secondary | ICD-10-CM | POA: Diagnosis not present

## 2017-04-02 DIAGNOSIS — S52501A Unspecified fracture of the lower end of right radius, initial encounter for closed fracture: Secondary | ICD-10-CM | POA: Diagnosis not present

## 2017-04-02 DIAGNOSIS — S42402A Unspecified fracture of lower end of left humerus, initial encounter for closed fracture: Secondary | ICD-10-CM | POA: Diagnosis not present

## 2017-04-02 DIAGNOSIS — S42001A Fracture of unspecified part of right clavicle, initial encounter for closed fracture: Secondary | ICD-10-CM | POA: Diagnosis not present

## 2017-04-02 DIAGNOSIS — S42002A Fracture of unspecified part of left clavicle, initial encounter for closed fracture: Secondary | ICD-10-CM | POA: Diagnosis not present

## 2017-04-03 DIAGNOSIS — S42002A Fracture of unspecified part of left clavicle, initial encounter for closed fracture: Secondary | ICD-10-CM | POA: Diagnosis not present

## 2017-04-03 DIAGNOSIS — S52501A Unspecified fracture of the lower end of right radius, initial encounter for closed fracture: Secondary | ICD-10-CM | POA: Diagnosis not present

## 2017-04-03 DIAGNOSIS — S42001A Fracture of unspecified part of right clavicle, initial encounter for closed fracture: Secondary | ICD-10-CM | POA: Diagnosis not present

## 2017-04-03 DIAGNOSIS — S42402A Unspecified fracture of lower end of left humerus, initial encounter for closed fracture: Secondary | ICD-10-CM | POA: Diagnosis not present

## 2017-04-04 DIAGNOSIS — S42402A Unspecified fracture of lower end of left humerus, initial encounter for closed fracture: Secondary | ICD-10-CM | POA: Diagnosis not present

## 2017-04-04 DIAGNOSIS — S42001A Fracture of unspecified part of right clavicle, initial encounter for closed fracture: Secondary | ICD-10-CM | POA: Diagnosis not present

## 2017-04-04 DIAGNOSIS — S52501A Unspecified fracture of the lower end of right radius, initial encounter for closed fracture: Secondary | ICD-10-CM | POA: Diagnosis not present

## 2017-04-04 DIAGNOSIS — S42002A Fracture of unspecified part of left clavicle, initial encounter for closed fracture: Secondary | ICD-10-CM | POA: Diagnosis not present

## 2017-04-05 DIAGNOSIS — S52501A Unspecified fracture of the lower end of right radius, initial encounter for closed fracture: Secondary | ICD-10-CM | POA: Diagnosis not present

## 2017-04-05 DIAGNOSIS — S42002A Fracture of unspecified part of left clavicle, initial encounter for closed fracture: Secondary | ICD-10-CM | POA: Diagnosis not present

## 2017-04-05 DIAGNOSIS — S42402A Unspecified fracture of lower end of left humerus, initial encounter for closed fracture: Secondary | ICD-10-CM | POA: Diagnosis not present

## 2017-04-05 DIAGNOSIS — S42001A Fracture of unspecified part of right clavicle, initial encounter for closed fracture: Secondary | ICD-10-CM | POA: Diagnosis not present

## 2017-04-06 DIAGNOSIS — M25542 Pain in joints of left hand: Secondary | ICD-10-CM | POA: Diagnosis not present

## 2017-04-06 DIAGNOSIS — S52501A Unspecified fracture of the lower end of right radius, initial encounter for closed fracture: Secondary | ICD-10-CM | POA: Diagnosis not present

## 2017-04-06 DIAGNOSIS — M25622 Stiffness of left elbow, not elsewhere classified: Secondary | ICD-10-CM | POA: Diagnosis not present

## 2017-04-06 DIAGNOSIS — M25631 Stiffness of right wrist, not elsewhere classified: Secondary | ICD-10-CM | POA: Diagnosis not present

## 2017-04-06 DIAGNOSIS — S42001A Fracture of unspecified part of right clavicle, initial encounter for closed fracture: Secondary | ICD-10-CM | POA: Diagnosis not present

## 2017-04-06 DIAGNOSIS — S42402A Unspecified fracture of lower end of left humerus, initial encounter for closed fracture: Secondary | ICD-10-CM | POA: Diagnosis not present

## 2017-04-06 DIAGNOSIS — M25422 Effusion, left elbow: Secondary | ICD-10-CM | POA: Diagnosis not present

## 2017-04-06 DIAGNOSIS — M25541 Pain in joints of right hand: Secondary | ICD-10-CM | POA: Diagnosis not present

## 2017-04-06 DIAGNOSIS — M25522 Pain in left elbow: Secondary | ICD-10-CM | POA: Diagnosis not present

## 2017-04-06 DIAGNOSIS — S42002A Fracture of unspecified part of left clavicle, initial encounter for closed fracture: Secondary | ICD-10-CM | POA: Diagnosis not present

## 2017-04-07 DIAGNOSIS — S52501A Unspecified fracture of the lower end of right radius, initial encounter for closed fracture: Secondary | ICD-10-CM | POA: Diagnosis not present

## 2017-04-07 DIAGNOSIS — S42001A Fracture of unspecified part of right clavicle, initial encounter for closed fracture: Secondary | ICD-10-CM | POA: Diagnosis not present

## 2017-04-07 DIAGNOSIS — S42002A Fracture of unspecified part of left clavicle, initial encounter for closed fracture: Secondary | ICD-10-CM | POA: Diagnosis not present

## 2017-04-07 DIAGNOSIS — S42402A Unspecified fracture of lower end of left humerus, initial encounter for closed fracture: Secondary | ICD-10-CM | POA: Diagnosis not present

## 2017-04-09 DIAGNOSIS — S42402A Unspecified fracture of lower end of left humerus, initial encounter for closed fracture: Secondary | ICD-10-CM | POA: Diagnosis not present

## 2017-04-09 DIAGNOSIS — S42001A Fracture of unspecified part of right clavicle, initial encounter for closed fracture: Secondary | ICD-10-CM | POA: Diagnosis not present

## 2017-04-09 DIAGNOSIS — S42002A Fracture of unspecified part of left clavicle, initial encounter for closed fracture: Secondary | ICD-10-CM | POA: Diagnosis not present

## 2017-04-09 DIAGNOSIS — S52501A Unspecified fracture of the lower end of right radius, initial encounter for closed fracture: Secondary | ICD-10-CM | POA: Diagnosis not present

## 2017-04-10 DIAGNOSIS — S42002A Fracture of unspecified part of left clavicle, initial encounter for closed fracture: Secondary | ICD-10-CM | POA: Diagnosis not present

## 2017-04-10 DIAGNOSIS — S52501A Unspecified fracture of the lower end of right radius, initial encounter for closed fracture: Secondary | ICD-10-CM | POA: Diagnosis not present

## 2017-04-10 DIAGNOSIS — S42402A Unspecified fracture of lower end of left humerus, initial encounter for closed fracture: Secondary | ICD-10-CM | POA: Diagnosis not present

## 2017-04-10 DIAGNOSIS — S42001A Fracture of unspecified part of right clavicle, initial encounter for closed fracture: Secondary | ICD-10-CM | POA: Diagnosis not present

## 2017-04-11 DIAGNOSIS — S42402A Unspecified fracture of lower end of left humerus, initial encounter for closed fracture: Secondary | ICD-10-CM | POA: Diagnosis not present

## 2017-04-11 DIAGNOSIS — S52501A Unspecified fracture of the lower end of right radius, initial encounter for closed fracture: Secondary | ICD-10-CM | POA: Diagnosis not present

## 2017-04-11 DIAGNOSIS — S42002A Fracture of unspecified part of left clavicle, initial encounter for closed fracture: Secondary | ICD-10-CM | POA: Diagnosis not present

## 2017-04-11 DIAGNOSIS — S42001A Fracture of unspecified part of right clavicle, initial encounter for closed fracture: Secondary | ICD-10-CM | POA: Diagnosis not present

## 2017-04-12 DIAGNOSIS — S42402A Unspecified fracture of lower end of left humerus, initial encounter for closed fracture: Secondary | ICD-10-CM | POA: Diagnosis not present

## 2017-04-12 DIAGNOSIS — S42002A Fracture of unspecified part of left clavicle, initial encounter for closed fracture: Secondary | ICD-10-CM | POA: Diagnosis not present

## 2017-04-12 DIAGNOSIS — S52501A Unspecified fracture of the lower end of right radius, initial encounter for closed fracture: Secondary | ICD-10-CM | POA: Diagnosis not present

## 2017-04-12 DIAGNOSIS — S42001A Fracture of unspecified part of right clavicle, initial encounter for closed fracture: Secondary | ICD-10-CM | POA: Diagnosis not present

## 2017-04-13 DIAGNOSIS — S52501A Unspecified fracture of the lower end of right radius, initial encounter for closed fracture: Secondary | ICD-10-CM | POA: Diagnosis not present

## 2017-04-13 DIAGNOSIS — S42402A Unspecified fracture of lower end of left humerus, initial encounter for closed fracture: Secondary | ICD-10-CM | POA: Diagnosis not present

## 2017-04-13 DIAGNOSIS — S42002A Fracture of unspecified part of left clavicle, initial encounter for closed fracture: Secondary | ICD-10-CM | POA: Diagnosis not present

## 2017-04-13 DIAGNOSIS — S42001A Fracture of unspecified part of right clavicle, initial encounter for closed fracture: Secondary | ICD-10-CM | POA: Diagnosis not present

## 2017-04-13 DIAGNOSIS — M25622 Stiffness of left elbow, not elsewhere classified: Secondary | ICD-10-CM | POA: Diagnosis not present

## 2017-04-13 DIAGNOSIS — M25631 Stiffness of right wrist, not elsewhere classified: Secondary | ICD-10-CM | POA: Diagnosis not present

## 2017-04-14 DIAGNOSIS — S52501A Unspecified fracture of the lower end of right radius, initial encounter for closed fracture: Secondary | ICD-10-CM | POA: Diagnosis not present

## 2017-04-14 DIAGNOSIS — S42002A Fracture of unspecified part of left clavicle, initial encounter for closed fracture: Secondary | ICD-10-CM | POA: Diagnosis not present

## 2017-04-14 DIAGNOSIS — S42001A Fracture of unspecified part of right clavicle, initial encounter for closed fracture: Secondary | ICD-10-CM | POA: Diagnosis not present

## 2017-04-14 DIAGNOSIS — S42402A Unspecified fracture of lower end of left humerus, initial encounter for closed fracture: Secondary | ICD-10-CM | POA: Diagnosis not present

## 2017-04-16 DIAGNOSIS — S42402A Unspecified fracture of lower end of left humerus, initial encounter for closed fracture: Secondary | ICD-10-CM | POA: Diagnosis not present

## 2017-04-16 DIAGNOSIS — S42002A Fracture of unspecified part of left clavicle, initial encounter for closed fracture: Secondary | ICD-10-CM | POA: Diagnosis not present

## 2017-04-16 DIAGNOSIS — S52501A Unspecified fracture of the lower end of right radius, initial encounter for closed fracture: Secondary | ICD-10-CM | POA: Diagnosis not present

## 2017-04-16 DIAGNOSIS — S42001A Fracture of unspecified part of right clavicle, initial encounter for closed fracture: Secondary | ICD-10-CM | POA: Diagnosis not present

## 2017-04-17 DIAGNOSIS — S42001A Fracture of unspecified part of right clavicle, initial encounter for closed fracture: Secondary | ICD-10-CM | POA: Diagnosis not present

## 2017-04-17 DIAGNOSIS — S42002A Fracture of unspecified part of left clavicle, initial encounter for closed fracture: Secondary | ICD-10-CM | POA: Diagnosis not present

## 2017-04-17 DIAGNOSIS — S42402A Unspecified fracture of lower end of left humerus, initial encounter for closed fracture: Secondary | ICD-10-CM | POA: Diagnosis not present

## 2017-04-17 DIAGNOSIS — S52501A Unspecified fracture of the lower end of right radius, initial encounter for closed fracture: Secondary | ICD-10-CM | POA: Diagnosis not present

## 2017-04-18 DIAGNOSIS — S42002A Fracture of unspecified part of left clavicle, initial encounter for closed fracture: Secondary | ICD-10-CM | POA: Diagnosis not present

## 2017-04-18 DIAGNOSIS — S42402A Unspecified fracture of lower end of left humerus, initial encounter for closed fracture: Secondary | ICD-10-CM | POA: Diagnosis not present

## 2017-04-18 DIAGNOSIS — S42001A Fracture of unspecified part of right clavicle, initial encounter for closed fracture: Secondary | ICD-10-CM | POA: Diagnosis not present

## 2017-04-18 DIAGNOSIS — S52501A Unspecified fracture of the lower end of right radius, initial encounter for closed fracture: Secondary | ICD-10-CM | POA: Diagnosis not present

## 2017-04-19 DIAGNOSIS — S52501A Unspecified fracture of the lower end of right radius, initial encounter for closed fracture: Secondary | ICD-10-CM | POA: Diagnosis not present

## 2017-04-19 DIAGNOSIS — S42001A Fracture of unspecified part of right clavicle, initial encounter for closed fracture: Secondary | ICD-10-CM | POA: Diagnosis not present

## 2017-04-19 DIAGNOSIS — S42002A Fracture of unspecified part of left clavicle, initial encounter for closed fracture: Secondary | ICD-10-CM | POA: Diagnosis not present

## 2017-04-19 DIAGNOSIS — S42402A Unspecified fracture of lower end of left humerus, initial encounter for closed fracture: Secondary | ICD-10-CM | POA: Diagnosis not present

## 2017-04-20 DIAGNOSIS — S42001A Fracture of unspecified part of right clavicle, initial encounter for closed fracture: Secondary | ICD-10-CM | POA: Diagnosis not present

## 2017-04-20 DIAGNOSIS — S42402A Unspecified fracture of lower end of left humerus, initial encounter for closed fracture: Secondary | ICD-10-CM | POA: Diagnosis not present

## 2017-04-20 DIAGNOSIS — S52501A Unspecified fracture of the lower end of right radius, initial encounter for closed fracture: Secondary | ICD-10-CM | POA: Diagnosis not present

## 2017-04-20 DIAGNOSIS — M25631 Stiffness of right wrist, not elsewhere classified: Secondary | ICD-10-CM | POA: Diagnosis not present

## 2017-04-20 DIAGNOSIS — M25622 Stiffness of left elbow, not elsewhere classified: Secondary | ICD-10-CM | POA: Diagnosis not present

## 2017-04-20 DIAGNOSIS — S42002A Fracture of unspecified part of left clavicle, initial encounter for closed fracture: Secondary | ICD-10-CM | POA: Diagnosis not present

## 2017-04-21 DIAGNOSIS — S42001A Fracture of unspecified part of right clavicle, initial encounter for closed fracture: Secondary | ICD-10-CM | POA: Diagnosis not present

## 2017-04-21 DIAGNOSIS — S52501A Unspecified fracture of the lower end of right radius, initial encounter for closed fracture: Secondary | ICD-10-CM | POA: Diagnosis not present

## 2017-04-21 DIAGNOSIS — S42402A Unspecified fracture of lower end of left humerus, initial encounter for closed fracture: Secondary | ICD-10-CM | POA: Diagnosis not present

## 2017-04-21 DIAGNOSIS — S42002A Fracture of unspecified part of left clavicle, initial encounter for closed fracture: Secondary | ICD-10-CM | POA: Diagnosis not present

## 2017-04-23 DIAGNOSIS — S42402A Unspecified fracture of lower end of left humerus, initial encounter for closed fracture: Secondary | ICD-10-CM | POA: Diagnosis not present

## 2017-04-23 DIAGNOSIS — S42001A Fracture of unspecified part of right clavicle, initial encounter for closed fracture: Secondary | ICD-10-CM | POA: Diagnosis not present

## 2017-04-23 DIAGNOSIS — S42002A Fracture of unspecified part of left clavicle, initial encounter for closed fracture: Secondary | ICD-10-CM | POA: Diagnosis not present

## 2017-04-23 DIAGNOSIS — S52501A Unspecified fracture of the lower end of right radius, initial encounter for closed fracture: Secondary | ICD-10-CM | POA: Diagnosis not present

## 2017-04-25 DIAGNOSIS — S42002A Fracture of unspecified part of left clavicle, initial encounter for closed fracture: Secondary | ICD-10-CM | POA: Diagnosis not present

## 2017-04-25 DIAGNOSIS — S42402A Unspecified fracture of lower end of left humerus, initial encounter for closed fracture: Secondary | ICD-10-CM | POA: Diagnosis not present

## 2017-04-25 DIAGNOSIS — S52501A Unspecified fracture of the lower end of right radius, initial encounter for closed fracture: Secondary | ICD-10-CM | POA: Diagnosis not present

## 2017-04-25 DIAGNOSIS — S42001A Fracture of unspecified part of right clavicle, initial encounter for closed fracture: Secondary | ICD-10-CM | POA: Diagnosis not present

## 2017-04-26 DIAGNOSIS — S42001A Fracture of unspecified part of right clavicle, initial encounter for closed fracture: Secondary | ICD-10-CM | POA: Diagnosis not present

## 2017-04-26 DIAGNOSIS — S42002D Fracture of unspecified part of left clavicle, subsequent encounter for fracture with routine healing: Secondary | ICD-10-CM | POA: Diagnosis not present

## 2017-04-26 DIAGNOSIS — S42002A Fracture of unspecified part of left clavicle, initial encounter for closed fracture: Secondary | ICD-10-CM | POA: Diagnosis not present

## 2017-04-26 DIAGNOSIS — S42402A Unspecified fracture of lower end of left humerus, initial encounter for closed fracture: Secondary | ICD-10-CM | POA: Diagnosis not present

## 2017-04-26 DIAGNOSIS — S52501A Unspecified fracture of the lower end of right radius, initial encounter for closed fracture: Secondary | ICD-10-CM | POA: Diagnosis not present

## 2017-04-26 DIAGNOSIS — S42412B Displaced simple supracondylar fracture without intercondylar fracture of left humerus, initial encounter for open fracture: Secondary | ICD-10-CM | POA: Diagnosis not present

## 2017-04-26 DIAGNOSIS — S52571A Other intraarticular fracture of lower end of right radius, initial encounter for closed fracture: Secondary | ICD-10-CM | POA: Diagnosis not present

## 2017-04-27 DIAGNOSIS — S42001A Fracture of unspecified part of right clavicle, initial encounter for closed fracture: Secondary | ICD-10-CM | POA: Diagnosis not present

## 2017-04-27 DIAGNOSIS — S42002A Fracture of unspecified part of left clavicle, initial encounter for closed fracture: Secondary | ICD-10-CM | POA: Diagnosis not present

## 2017-04-27 DIAGNOSIS — S52501A Unspecified fracture of the lower end of right radius, initial encounter for closed fracture: Secondary | ICD-10-CM | POA: Diagnosis not present

## 2017-04-27 DIAGNOSIS — S42402A Unspecified fracture of lower end of left humerus, initial encounter for closed fracture: Secondary | ICD-10-CM | POA: Diagnosis not present

## 2017-04-28 DIAGNOSIS — S42002A Fracture of unspecified part of left clavicle, initial encounter for closed fracture: Secondary | ICD-10-CM | POA: Diagnosis not present

## 2017-04-28 DIAGNOSIS — S42402A Unspecified fracture of lower end of left humerus, initial encounter for closed fracture: Secondary | ICD-10-CM | POA: Diagnosis not present

## 2017-04-28 DIAGNOSIS — S42001A Fracture of unspecified part of right clavicle, initial encounter for closed fracture: Secondary | ICD-10-CM | POA: Diagnosis not present

## 2017-04-28 DIAGNOSIS — S52501A Unspecified fracture of the lower end of right radius, initial encounter for closed fracture: Secondary | ICD-10-CM | POA: Diagnosis not present

## 2017-04-30 DIAGNOSIS — S42002A Fracture of unspecified part of left clavicle, initial encounter for closed fracture: Secondary | ICD-10-CM | POA: Diagnosis not present

## 2017-04-30 DIAGNOSIS — S42001A Fracture of unspecified part of right clavicle, initial encounter for closed fracture: Secondary | ICD-10-CM | POA: Diagnosis not present

## 2017-04-30 DIAGNOSIS — S52501A Unspecified fracture of the lower end of right radius, initial encounter for closed fracture: Secondary | ICD-10-CM | POA: Diagnosis not present

## 2017-04-30 DIAGNOSIS — S42402A Unspecified fracture of lower end of left humerus, initial encounter for closed fracture: Secondary | ICD-10-CM | POA: Diagnosis not present

## 2017-05-01 DIAGNOSIS — S42001A Fracture of unspecified part of right clavicle, initial encounter for closed fracture: Secondary | ICD-10-CM | POA: Diagnosis not present

## 2017-05-01 DIAGNOSIS — S52501A Unspecified fracture of the lower end of right radius, initial encounter for closed fracture: Secondary | ICD-10-CM | POA: Diagnosis not present

## 2017-05-01 DIAGNOSIS — S42402A Unspecified fracture of lower end of left humerus, initial encounter for closed fracture: Secondary | ICD-10-CM | POA: Diagnosis not present

## 2017-05-01 DIAGNOSIS — S42002A Fracture of unspecified part of left clavicle, initial encounter for closed fracture: Secondary | ICD-10-CM | POA: Diagnosis not present

## 2017-05-02 DIAGNOSIS — S52501A Unspecified fracture of the lower end of right radius, initial encounter for closed fracture: Secondary | ICD-10-CM | POA: Diagnosis not present

## 2017-05-02 DIAGNOSIS — S42402A Unspecified fracture of lower end of left humerus, initial encounter for closed fracture: Secondary | ICD-10-CM | POA: Diagnosis not present

## 2017-05-02 DIAGNOSIS — S42002A Fracture of unspecified part of left clavicle, initial encounter for closed fracture: Secondary | ICD-10-CM | POA: Diagnosis not present

## 2017-05-02 DIAGNOSIS — S42001A Fracture of unspecified part of right clavicle, initial encounter for closed fracture: Secondary | ICD-10-CM | POA: Diagnosis not present

## 2017-05-03 DIAGNOSIS — S42002A Fracture of unspecified part of left clavicle, initial encounter for closed fracture: Secondary | ICD-10-CM | POA: Diagnosis not present

## 2017-05-03 DIAGNOSIS — S42402A Unspecified fracture of lower end of left humerus, initial encounter for closed fracture: Secondary | ICD-10-CM | POA: Diagnosis not present

## 2017-05-03 DIAGNOSIS — S52501A Unspecified fracture of the lower end of right radius, initial encounter for closed fracture: Secondary | ICD-10-CM | POA: Diagnosis not present

## 2017-05-03 DIAGNOSIS — S42001A Fracture of unspecified part of right clavicle, initial encounter for closed fracture: Secondary | ICD-10-CM | POA: Diagnosis not present

## 2017-05-04 DIAGNOSIS — S52501A Unspecified fracture of the lower end of right radius, initial encounter for closed fracture: Secondary | ICD-10-CM | POA: Diagnosis not present

## 2017-05-04 DIAGNOSIS — S42001A Fracture of unspecified part of right clavicle, initial encounter for closed fracture: Secondary | ICD-10-CM | POA: Diagnosis not present

## 2017-05-04 DIAGNOSIS — S42402A Unspecified fracture of lower end of left humerus, initial encounter for closed fracture: Secondary | ICD-10-CM | POA: Diagnosis not present

## 2017-05-04 DIAGNOSIS — S42002A Fracture of unspecified part of left clavicle, initial encounter for closed fracture: Secondary | ICD-10-CM | POA: Diagnosis not present

## 2017-05-05 DIAGNOSIS — S42001A Fracture of unspecified part of right clavicle, initial encounter for closed fracture: Secondary | ICD-10-CM | POA: Diagnosis not present

## 2017-05-05 DIAGNOSIS — S42002A Fracture of unspecified part of left clavicle, initial encounter for closed fracture: Secondary | ICD-10-CM | POA: Diagnosis not present

## 2017-05-05 DIAGNOSIS — S52501A Unspecified fracture of the lower end of right radius, initial encounter for closed fracture: Secondary | ICD-10-CM | POA: Diagnosis not present

## 2017-05-05 DIAGNOSIS — S42402A Unspecified fracture of lower end of left humerus, initial encounter for closed fracture: Secondary | ICD-10-CM | POA: Diagnosis not present

## 2017-05-11 DIAGNOSIS — M25622 Stiffness of left elbow, not elsewhere classified: Secondary | ICD-10-CM | POA: Diagnosis not present

## 2017-05-11 DIAGNOSIS — S42412B Displaced simple supracondylar fracture without intercondylar fracture of left humerus, initial encounter for open fracture: Secondary | ICD-10-CM | POA: Diagnosis not present

## 2017-05-11 DIAGNOSIS — M25631 Stiffness of right wrist, not elsewhere classified: Secondary | ICD-10-CM | POA: Diagnosis not present

## 2017-05-14 DIAGNOSIS — S42002A Fracture of unspecified part of left clavicle, initial encounter for closed fracture: Secondary | ICD-10-CM | POA: Diagnosis not present

## 2017-05-14 DIAGNOSIS — S42402A Unspecified fracture of lower end of left humerus, initial encounter for closed fracture: Secondary | ICD-10-CM | POA: Diagnosis not present

## 2017-05-14 DIAGNOSIS — S52501A Unspecified fracture of the lower end of right radius, initial encounter for closed fracture: Secondary | ICD-10-CM | POA: Diagnosis not present

## 2017-05-14 DIAGNOSIS — S42001A Fracture of unspecified part of right clavicle, initial encounter for closed fracture: Secondary | ICD-10-CM | POA: Diagnosis not present

## 2017-05-15 DIAGNOSIS — S42001A Fracture of unspecified part of right clavicle, initial encounter for closed fracture: Secondary | ICD-10-CM | POA: Diagnosis not present

## 2017-05-15 DIAGNOSIS — S52501A Unspecified fracture of the lower end of right radius, initial encounter for closed fracture: Secondary | ICD-10-CM | POA: Diagnosis not present

## 2017-05-15 DIAGNOSIS — S42402A Unspecified fracture of lower end of left humerus, initial encounter for closed fracture: Secondary | ICD-10-CM | POA: Diagnosis not present

## 2017-05-15 DIAGNOSIS — S42002A Fracture of unspecified part of left clavicle, initial encounter for closed fracture: Secondary | ICD-10-CM | POA: Diagnosis not present

## 2017-05-16 DIAGNOSIS — S42001A Fracture of unspecified part of right clavicle, initial encounter for closed fracture: Secondary | ICD-10-CM | POA: Diagnosis not present

## 2017-05-16 DIAGNOSIS — S52501A Unspecified fracture of the lower end of right radius, initial encounter for closed fracture: Secondary | ICD-10-CM | POA: Diagnosis not present

## 2017-05-16 DIAGNOSIS — S42402A Unspecified fracture of lower end of left humerus, initial encounter for closed fracture: Secondary | ICD-10-CM | POA: Diagnosis not present

## 2017-05-16 DIAGNOSIS — S42002A Fracture of unspecified part of left clavicle, initial encounter for closed fracture: Secondary | ICD-10-CM | POA: Diagnosis not present

## 2017-05-17 DIAGNOSIS — S52501A Unspecified fracture of the lower end of right radius, initial encounter for closed fracture: Secondary | ICD-10-CM | POA: Diagnosis not present

## 2017-05-17 DIAGNOSIS — S42002A Fracture of unspecified part of left clavicle, initial encounter for closed fracture: Secondary | ICD-10-CM | POA: Diagnosis not present

## 2017-05-17 DIAGNOSIS — S42001A Fracture of unspecified part of right clavicle, initial encounter for closed fracture: Secondary | ICD-10-CM | POA: Diagnosis not present

## 2017-05-17 DIAGNOSIS — S42402A Unspecified fracture of lower end of left humerus, initial encounter for closed fracture: Secondary | ICD-10-CM | POA: Diagnosis not present

## 2017-05-18 DIAGNOSIS — S42402A Unspecified fracture of lower end of left humerus, initial encounter for closed fracture: Secondary | ICD-10-CM | POA: Diagnosis not present

## 2017-05-18 DIAGNOSIS — M25631 Stiffness of right wrist, not elsewhere classified: Secondary | ICD-10-CM | POA: Diagnosis not present

## 2017-05-18 DIAGNOSIS — M25622 Stiffness of left elbow, not elsewhere classified: Secondary | ICD-10-CM | POA: Diagnosis not present

## 2017-05-18 DIAGNOSIS — S52501A Unspecified fracture of the lower end of right radius, initial encounter for closed fracture: Secondary | ICD-10-CM | POA: Diagnosis not present

## 2017-05-18 DIAGNOSIS — S42001A Fracture of unspecified part of right clavicle, initial encounter for closed fracture: Secondary | ICD-10-CM | POA: Diagnosis not present

## 2017-05-18 DIAGNOSIS — S42002A Fracture of unspecified part of left clavicle, initial encounter for closed fracture: Secondary | ICD-10-CM | POA: Diagnosis not present

## 2017-05-19 DIAGNOSIS — S42001A Fracture of unspecified part of right clavicle, initial encounter for closed fracture: Secondary | ICD-10-CM | POA: Diagnosis not present

## 2017-05-19 DIAGNOSIS — S42002A Fracture of unspecified part of left clavicle, initial encounter for closed fracture: Secondary | ICD-10-CM | POA: Diagnosis not present

## 2017-05-19 DIAGNOSIS — S52501A Unspecified fracture of the lower end of right radius, initial encounter for closed fracture: Secondary | ICD-10-CM | POA: Diagnosis not present

## 2017-05-19 DIAGNOSIS — S42402A Unspecified fracture of lower end of left humerus, initial encounter for closed fracture: Secondary | ICD-10-CM | POA: Diagnosis not present

## 2017-05-21 DIAGNOSIS — S42402A Unspecified fracture of lower end of left humerus, initial encounter for closed fracture: Secondary | ICD-10-CM | POA: Diagnosis not present

## 2017-05-21 DIAGNOSIS — S42002A Fracture of unspecified part of left clavicle, initial encounter for closed fracture: Secondary | ICD-10-CM | POA: Diagnosis not present

## 2017-05-21 DIAGNOSIS — S42001A Fracture of unspecified part of right clavicle, initial encounter for closed fracture: Secondary | ICD-10-CM | POA: Diagnosis not present

## 2017-05-21 DIAGNOSIS — S52501A Unspecified fracture of the lower end of right radius, initial encounter for closed fracture: Secondary | ICD-10-CM | POA: Diagnosis not present

## 2017-05-22 DIAGNOSIS — S42002A Fracture of unspecified part of left clavicle, initial encounter for closed fracture: Secondary | ICD-10-CM | POA: Diagnosis not present

## 2017-05-22 DIAGNOSIS — S42001A Fracture of unspecified part of right clavicle, initial encounter for closed fracture: Secondary | ICD-10-CM | POA: Diagnosis not present

## 2017-05-22 DIAGNOSIS — S42402A Unspecified fracture of lower end of left humerus, initial encounter for closed fracture: Secondary | ICD-10-CM | POA: Diagnosis not present

## 2017-05-22 DIAGNOSIS — S52501A Unspecified fracture of the lower end of right radius, initial encounter for closed fracture: Secondary | ICD-10-CM | POA: Diagnosis not present

## 2017-05-23 DIAGNOSIS — S42001A Fracture of unspecified part of right clavicle, initial encounter for closed fracture: Secondary | ICD-10-CM | POA: Diagnosis not present

## 2017-05-23 DIAGNOSIS — S42402A Unspecified fracture of lower end of left humerus, initial encounter for closed fracture: Secondary | ICD-10-CM | POA: Diagnosis not present

## 2017-05-23 DIAGNOSIS — S42002A Fracture of unspecified part of left clavicle, initial encounter for closed fracture: Secondary | ICD-10-CM | POA: Diagnosis not present

## 2017-05-23 DIAGNOSIS — S52501A Unspecified fracture of the lower end of right radius, initial encounter for closed fracture: Secondary | ICD-10-CM | POA: Diagnosis not present

## 2017-05-24 DIAGNOSIS — S52571A Other intraarticular fracture of lower end of right radius, initial encounter for closed fracture: Secondary | ICD-10-CM | POA: Diagnosis not present

## 2017-05-24 DIAGNOSIS — S42402A Unspecified fracture of lower end of left humerus, initial encounter for closed fracture: Secondary | ICD-10-CM | POA: Diagnosis not present

## 2017-05-24 DIAGNOSIS — S42412B Displaced simple supracondylar fracture without intercondylar fracture of left humerus, initial encounter for open fracture: Secondary | ICD-10-CM | POA: Diagnosis not present

## 2017-05-24 DIAGNOSIS — S52501A Unspecified fracture of the lower end of right radius, initial encounter for closed fracture: Secondary | ICD-10-CM | POA: Diagnosis not present

## 2017-05-24 DIAGNOSIS — S42002A Fracture of unspecified part of left clavicle, initial encounter for closed fracture: Secondary | ICD-10-CM | POA: Diagnosis not present

## 2017-05-24 DIAGNOSIS — S42001A Fracture of unspecified part of right clavicle, initial encounter for closed fracture: Secondary | ICD-10-CM | POA: Diagnosis not present

## 2017-05-25 DIAGNOSIS — S42002A Fracture of unspecified part of left clavicle, initial encounter for closed fracture: Secondary | ICD-10-CM | POA: Diagnosis not present

## 2017-05-25 DIAGNOSIS — S42001A Fracture of unspecified part of right clavicle, initial encounter for closed fracture: Secondary | ICD-10-CM | POA: Diagnosis not present

## 2017-05-25 DIAGNOSIS — R531 Weakness: Secondary | ICD-10-CM | POA: Diagnosis not present

## 2017-05-25 DIAGNOSIS — S42412B Displaced simple supracondylar fracture without intercondylar fracture of left humerus, initial encounter for open fracture: Secondary | ICD-10-CM | POA: Diagnosis not present

## 2017-05-25 DIAGNOSIS — S42402A Unspecified fracture of lower end of left humerus, initial encounter for closed fracture: Secondary | ICD-10-CM | POA: Diagnosis not present

## 2017-05-25 DIAGNOSIS — M25622 Stiffness of left elbow, not elsewhere classified: Secondary | ICD-10-CM | POA: Diagnosis not present

## 2017-05-25 DIAGNOSIS — S52571A Other intraarticular fracture of lower end of right radius, initial encounter for closed fracture: Secondary | ICD-10-CM | POA: Diagnosis not present

## 2017-05-25 DIAGNOSIS — M25631 Stiffness of right wrist, not elsewhere classified: Secondary | ICD-10-CM | POA: Diagnosis not present

## 2017-05-25 DIAGNOSIS — S52501A Unspecified fracture of the lower end of right radius, initial encounter for closed fracture: Secondary | ICD-10-CM | POA: Diagnosis not present

## 2017-05-26 DIAGNOSIS — S42002A Fracture of unspecified part of left clavicle, initial encounter for closed fracture: Secondary | ICD-10-CM | POA: Diagnosis not present

## 2017-05-26 DIAGNOSIS — S52501A Unspecified fracture of the lower end of right radius, initial encounter for closed fracture: Secondary | ICD-10-CM | POA: Diagnosis not present

## 2017-05-26 DIAGNOSIS — S42402A Unspecified fracture of lower end of left humerus, initial encounter for closed fracture: Secondary | ICD-10-CM | POA: Diagnosis not present

## 2017-05-26 DIAGNOSIS — S42001A Fracture of unspecified part of right clavicle, initial encounter for closed fracture: Secondary | ICD-10-CM | POA: Diagnosis not present

## 2017-06-04 DIAGNOSIS — R6889 Other general symptoms and signs: Secondary | ICD-10-CM | POA: Diagnosis not present

## 2017-06-04 DIAGNOSIS — J069 Acute upper respiratory infection, unspecified: Secondary | ICD-10-CM | POA: Diagnosis not present

## 2017-06-05 DIAGNOSIS — S42402A Unspecified fracture of lower end of left humerus, initial encounter for closed fracture: Secondary | ICD-10-CM | POA: Diagnosis not present

## 2017-06-05 DIAGNOSIS — S42001A Fracture of unspecified part of right clavicle, initial encounter for closed fracture: Secondary | ICD-10-CM | POA: Diagnosis not present

## 2017-06-05 DIAGNOSIS — S42002A Fracture of unspecified part of left clavicle, initial encounter for closed fracture: Secondary | ICD-10-CM | POA: Diagnosis not present

## 2017-06-05 DIAGNOSIS — S42002D Fracture of unspecified part of left clavicle, subsequent encounter for fracture with routine healing: Secondary | ICD-10-CM | POA: Diagnosis not present

## 2017-06-05 DIAGNOSIS — S42492D Other displaced fracture of lower end of left humerus, subsequent encounter for fracture with routine healing: Secondary | ICD-10-CM | POA: Diagnosis not present

## 2017-06-05 DIAGNOSIS — S52501A Unspecified fracture of the lower end of right radius, initial encounter for closed fracture: Secondary | ICD-10-CM | POA: Diagnosis not present

## 2017-06-07 DIAGNOSIS — S42001A Fracture of unspecified part of right clavicle, initial encounter for closed fracture: Secondary | ICD-10-CM | POA: Diagnosis not present

## 2017-06-07 DIAGNOSIS — S52501A Unspecified fracture of the lower end of right radius, initial encounter for closed fracture: Secondary | ICD-10-CM | POA: Diagnosis not present

## 2017-06-07 DIAGNOSIS — S42402A Unspecified fracture of lower end of left humerus, initial encounter for closed fracture: Secondary | ICD-10-CM | POA: Diagnosis not present

## 2017-06-07 DIAGNOSIS — S42002A Fracture of unspecified part of left clavicle, initial encounter for closed fracture: Secondary | ICD-10-CM | POA: Diagnosis not present

## 2017-06-09 DIAGNOSIS — S52501A Unspecified fracture of the lower end of right radius, initial encounter for closed fracture: Secondary | ICD-10-CM | POA: Diagnosis not present

## 2017-06-09 DIAGNOSIS — S42002A Fracture of unspecified part of left clavicle, initial encounter for closed fracture: Secondary | ICD-10-CM | POA: Diagnosis not present

## 2017-06-09 DIAGNOSIS — S42402A Unspecified fracture of lower end of left humerus, initial encounter for closed fracture: Secondary | ICD-10-CM | POA: Diagnosis not present

## 2017-06-09 DIAGNOSIS — S42001A Fracture of unspecified part of right clavicle, initial encounter for closed fracture: Secondary | ICD-10-CM | POA: Diagnosis not present

## 2017-06-12 DIAGNOSIS — S52501A Unspecified fracture of the lower end of right radius, initial encounter for closed fracture: Secondary | ICD-10-CM | POA: Diagnosis not present

## 2017-06-12 DIAGNOSIS — S42002A Fracture of unspecified part of left clavicle, initial encounter for closed fracture: Secondary | ICD-10-CM | POA: Diagnosis not present

## 2017-06-12 DIAGNOSIS — S42001A Fracture of unspecified part of right clavicle, initial encounter for closed fracture: Secondary | ICD-10-CM | POA: Diagnosis not present

## 2017-06-12 DIAGNOSIS — S42402A Unspecified fracture of lower end of left humerus, initial encounter for closed fracture: Secondary | ICD-10-CM | POA: Diagnosis not present

## 2017-06-14 DIAGNOSIS — S42001A Fracture of unspecified part of right clavicle, initial encounter for closed fracture: Secondary | ICD-10-CM | POA: Diagnosis not present

## 2017-06-14 DIAGNOSIS — S42402A Unspecified fracture of lower end of left humerus, initial encounter for closed fracture: Secondary | ICD-10-CM | POA: Diagnosis not present

## 2017-06-14 DIAGNOSIS — S52501A Unspecified fracture of the lower end of right radius, initial encounter for closed fracture: Secondary | ICD-10-CM | POA: Diagnosis not present

## 2017-06-14 DIAGNOSIS — S42002A Fracture of unspecified part of left clavicle, initial encounter for closed fracture: Secondary | ICD-10-CM | POA: Diagnosis not present

## 2017-06-15 DIAGNOSIS — M25622 Stiffness of left elbow, not elsewhere classified: Secondary | ICD-10-CM | POA: Diagnosis not present

## 2017-06-15 DIAGNOSIS — R29898 Other symptoms and signs involving the musculoskeletal system: Secondary | ICD-10-CM | POA: Diagnosis not present

## 2017-06-15 DIAGNOSIS — M25522 Pain in left elbow: Secondary | ICD-10-CM | POA: Diagnosis not present

## 2017-06-16 DIAGNOSIS — S42001A Fracture of unspecified part of right clavicle, initial encounter for closed fracture: Secondary | ICD-10-CM | POA: Diagnosis not present

## 2017-06-16 DIAGNOSIS — S52501A Unspecified fracture of the lower end of right radius, initial encounter for closed fracture: Secondary | ICD-10-CM | POA: Diagnosis not present

## 2017-06-16 DIAGNOSIS — S42402A Unspecified fracture of lower end of left humerus, initial encounter for closed fracture: Secondary | ICD-10-CM | POA: Diagnosis not present

## 2017-06-16 DIAGNOSIS — S42002A Fracture of unspecified part of left clavicle, initial encounter for closed fracture: Secondary | ICD-10-CM | POA: Diagnosis not present

## 2017-06-22 DIAGNOSIS — M25522 Pain in left elbow: Secondary | ICD-10-CM | POA: Diagnosis not present

## 2017-06-22 DIAGNOSIS — R29898 Other symptoms and signs involving the musculoskeletal system: Secondary | ICD-10-CM | POA: Diagnosis not present

## 2017-06-22 DIAGNOSIS — M25622 Stiffness of left elbow, not elsewhere classified: Secondary | ICD-10-CM | POA: Diagnosis not present

## 2017-06-27 DIAGNOSIS — G8929 Other chronic pain: Secondary | ICD-10-CM | POA: Diagnosis not present

## 2017-06-27 DIAGNOSIS — M8580 Other specified disorders of bone density and structure, unspecified site: Secondary | ICD-10-CM | POA: Diagnosis not present

## 2017-06-27 DIAGNOSIS — M25562 Pain in left knee: Secondary | ICD-10-CM | POA: Diagnosis not present

## 2017-06-27 DIAGNOSIS — E7800 Pure hypercholesterolemia, unspecified: Secondary | ICD-10-CM | POA: Diagnosis not present

## 2017-06-27 DIAGNOSIS — I10 Essential (primary) hypertension: Secondary | ICD-10-CM | POA: Diagnosis not present

## 2017-06-27 DIAGNOSIS — D696 Thrombocytopenia, unspecified: Secondary | ICD-10-CM | POA: Diagnosis not present

## 2017-06-27 DIAGNOSIS — M25572 Pain in left ankle and joints of left foot: Secondary | ICD-10-CM | POA: Diagnosis not present

## 2017-06-27 DIAGNOSIS — M25561 Pain in right knee: Secondary | ICD-10-CM | POA: Diagnosis not present

## 2017-06-27 DIAGNOSIS — D72819 Decreased white blood cell count, unspecified: Secondary | ICD-10-CM | POA: Diagnosis not present

## 2017-06-29 DIAGNOSIS — R531 Weakness: Secondary | ICD-10-CM | POA: Diagnosis not present

## 2017-06-29 DIAGNOSIS — M25622 Stiffness of left elbow, not elsewhere classified: Secondary | ICD-10-CM | POA: Diagnosis not present

## 2017-07-03 ENCOUNTER — Other Ambulatory Visit: Payer: Self-pay | Admitting: Family Medicine

## 2017-07-03 DIAGNOSIS — M8580 Other specified disorders of bone density and structure, unspecified site: Secondary | ICD-10-CM

## 2017-07-06 DIAGNOSIS — M170 Bilateral primary osteoarthritis of knee: Secondary | ICD-10-CM | POA: Diagnosis not present

## 2017-07-06 DIAGNOSIS — M25561 Pain in right knee: Secondary | ICD-10-CM | POA: Diagnosis not present

## 2017-07-06 DIAGNOSIS — M25562 Pain in left knee: Secondary | ICD-10-CM | POA: Diagnosis not present

## 2017-07-13 DIAGNOSIS — M25622 Stiffness of left elbow, not elsewhere classified: Secondary | ICD-10-CM | POA: Diagnosis not present

## 2017-07-13 DIAGNOSIS — R29898 Other symptoms and signs involving the musculoskeletal system: Secondary | ICD-10-CM | POA: Diagnosis not present

## 2017-07-20 DIAGNOSIS — M25622 Stiffness of left elbow, not elsewhere classified: Secondary | ICD-10-CM | POA: Diagnosis not present

## 2017-07-20 DIAGNOSIS — R29898 Other symptoms and signs involving the musculoskeletal system: Secondary | ICD-10-CM | POA: Diagnosis not present

## 2017-07-26 DIAGNOSIS — R29898 Other symptoms and signs involving the musculoskeletal system: Secondary | ICD-10-CM | POA: Diagnosis not present

## 2017-07-26 DIAGNOSIS — M25622 Stiffness of left elbow, not elsewhere classified: Secondary | ICD-10-CM | POA: Diagnosis not present

## 2017-07-27 DIAGNOSIS — H04123 Dry eye syndrome of bilateral lacrimal glands: Secondary | ICD-10-CM | POA: Diagnosis not present

## 2017-08-03 DIAGNOSIS — M25622 Stiffness of left elbow, not elsewhere classified: Secondary | ICD-10-CM | POA: Diagnosis not present

## 2017-08-03 DIAGNOSIS — R531 Weakness: Secondary | ICD-10-CM | POA: Diagnosis not present

## 2017-08-10 DIAGNOSIS — M25522 Pain in left elbow: Secondary | ICD-10-CM | POA: Diagnosis not present

## 2017-08-10 DIAGNOSIS — M25622 Stiffness of left elbow, not elsewhere classified: Secondary | ICD-10-CM | POA: Diagnosis not present

## 2017-08-24 DIAGNOSIS — M25622 Stiffness of left elbow, not elsewhere classified: Secondary | ICD-10-CM | POA: Diagnosis not present

## 2017-08-24 DIAGNOSIS — R531 Weakness: Secondary | ICD-10-CM | POA: Diagnosis not present

## 2017-08-28 ENCOUNTER — Ambulatory Visit
Admission: RE | Admit: 2017-08-28 | Discharge: 2017-08-28 | Disposition: A | Discharge status: Home / Self Care | Payer: Medicare HMO | Source: Ambulatory Visit | Attending: Family Medicine | Admitting: Family Medicine

## 2017-08-28 DIAGNOSIS — M8580 Other specified disorders of bone density and structure, unspecified site: Secondary | ICD-10-CM

## 2017-08-28 DIAGNOSIS — M810 Age-related osteoporosis without current pathological fracture: Secondary | ICD-10-CM | POA: Insufficient documentation

## 2017-08-28 DIAGNOSIS — Z780 Asymptomatic menopausal state: Secondary | ICD-10-CM | POA: Diagnosis not present

## 2017-08-28 DIAGNOSIS — I10 Essential (primary) hypertension: Secondary | ICD-10-CM | POA: Insufficient documentation

## 2017-08-30 DIAGNOSIS — Z79899 Other long term (current) drug therapy: Secondary | ICD-10-CM | POA: Diagnosis not present

## 2017-08-30 DIAGNOSIS — I10 Essential (primary) hypertension: Secondary | ICD-10-CM | POA: Diagnosis not present

## 2017-08-30 DIAGNOSIS — E559 Vitamin D deficiency, unspecified: Secondary | ICD-10-CM | POA: Diagnosis not present

## 2017-08-30 DIAGNOSIS — R200 Anesthesia of skin: Secondary | ICD-10-CM | POA: Diagnosis not present

## 2017-08-30 DIAGNOSIS — R202 Paresthesia of skin: Secondary | ICD-10-CM | POA: Diagnosis not present

## 2017-08-30 DIAGNOSIS — E538 Deficiency of other specified B group vitamins: Secondary | ICD-10-CM | POA: Diagnosis not present

## 2017-08-30 DIAGNOSIS — R5383 Other fatigue: Secondary | ICD-10-CM | POA: Diagnosis not present

## 2017-08-31 DIAGNOSIS — M25622 Stiffness of left elbow, not elsewhere classified: Secondary | ICD-10-CM | POA: Diagnosis not present

## 2017-08-31 DIAGNOSIS — R29898 Other symptoms and signs involving the musculoskeletal system: Secondary | ICD-10-CM | POA: Diagnosis not present

## 2017-08-31 DIAGNOSIS — M25522 Pain in left elbow: Secondary | ICD-10-CM | POA: Diagnosis not present

## 2017-09-06 DIAGNOSIS — D696 Thrombocytopenia, unspecified: Secondary | ICD-10-CM | POA: Diagnosis not present

## 2017-09-06 DIAGNOSIS — E559 Vitamin D deficiency, unspecified: Secondary | ICD-10-CM | POA: Diagnosis not present

## 2017-09-06 DIAGNOSIS — M79604 Pain in right leg: Secondary | ICD-10-CM | POA: Diagnosis not present

## 2017-09-06 DIAGNOSIS — N289 Disorder of kidney and ureter, unspecified: Secondary | ICD-10-CM | POA: Diagnosis not present

## 2017-09-07 DIAGNOSIS — R531 Weakness: Secondary | ICD-10-CM | POA: Diagnosis not present

## 2017-09-07 DIAGNOSIS — M25622 Stiffness of left elbow, not elsewhere classified: Secondary | ICD-10-CM | POA: Diagnosis not present

## 2017-09-12 DIAGNOSIS — M810 Age-related osteoporosis without current pathological fracture: Secondary | ICD-10-CM | POA: Diagnosis not present

## 2017-09-12 DIAGNOSIS — M7989 Other specified soft tissue disorders: Secondary | ICD-10-CM | POA: Diagnosis not present

## 2017-09-12 DIAGNOSIS — M79604 Pain in right leg: Secondary | ICD-10-CM | POA: Diagnosis not present

## 2017-09-21 DIAGNOSIS — M25622 Stiffness of left elbow, not elsewhere classified: Secondary | ICD-10-CM | POA: Diagnosis not present

## 2017-09-21 DIAGNOSIS — R531 Weakness: Secondary | ICD-10-CM | POA: Diagnosis not present

## 2017-09-25 DIAGNOSIS — S42492D Other displaced fracture of lower end of left humerus, subsequent encounter for fracture with routine healing: Secondary | ICD-10-CM | POA: Diagnosis not present

## 2017-09-28 DIAGNOSIS — R29898 Other symptoms and signs involving the musculoskeletal system: Secondary | ICD-10-CM | POA: Diagnosis not present

## 2017-09-28 DIAGNOSIS — M25622 Stiffness of left elbow, not elsewhere classified: Secondary | ICD-10-CM | POA: Diagnosis not present

## 2017-09-28 DIAGNOSIS — M25522 Pain in left elbow: Secondary | ICD-10-CM | POA: Diagnosis not present

## 2017-12-19 ENCOUNTER — Other Ambulatory Visit: Payer: Self-pay | Admitting: Family Medicine

## 2017-12-19 DIAGNOSIS — Z1231 Encounter for screening mammogram for malignant neoplasm of breast: Secondary | ICD-10-CM

## 2017-12-28 DIAGNOSIS — D72819 Decreased white blood cell count, unspecified: Secondary | ICD-10-CM | POA: Diagnosis not present

## 2017-12-28 DIAGNOSIS — M8580 Other specified disorders of bone density and structure, unspecified site: Secondary | ICD-10-CM | POA: Diagnosis not present

## 2017-12-28 DIAGNOSIS — E7800 Pure hypercholesterolemia, unspecified: Secondary | ICD-10-CM | POA: Diagnosis not present

## 2017-12-28 DIAGNOSIS — I10 Essential (primary) hypertension: Secondary | ICD-10-CM | POA: Diagnosis not present

## 2017-12-28 DIAGNOSIS — D696 Thrombocytopenia, unspecified: Secondary | ICD-10-CM | POA: Diagnosis not present

## 2018-01-02 DIAGNOSIS — D696 Thrombocytopenia, unspecified: Secondary | ICD-10-CM | POA: Diagnosis not present

## 2018-01-02 DIAGNOSIS — D708 Other neutropenia: Secondary | ICD-10-CM | POA: Diagnosis not present

## 2018-01-02 DIAGNOSIS — K219 Gastro-esophageal reflux disease without esophagitis: Secondary | ICD-10-CM | POA: Diagnosis not present

## 2018-01-02 DIAGNOSIS — I10 Essential (primary) hypertension: Secondary | ICD-10-CM | POA: Diagnosis not present

## 2018-01-02 DIAGNOSIS — E7800 Pure hypercholesterolemia, unspecified: Secondary | ICD-10-CM | POA: Diagnosis not present

## 2018-01-02 DIAGNOSIS — D72819 Decreased white blood cell count, unspecified: Secondary | ICD-10-CM | POA: Diagnosis not present

## 2018-01-02 DIAGNOSIS — Z1239 Encounter for other screening for malignant neoplasm of breast: Secondary | ICD-10-CM | POA: Diagnosis not present

## 2018-01-09 ENCOUNTER — Ambulatory Visit
Admission: RE | Admit: 2018-01-09 | Discharge: 2018-01-09 | Disposition: A | Discharge status: Home / Self Care | Payer: Medicare HMO | Source: Ambulatory Visit | Attending: Family Medicine | Admitting: Family Medicine

## 2018-01-09 DIAGNOSIS — Z1231 Encounter for screening mammogram for malignant neoplasm of breast: Secondary | ICD-10-CM | POA: Diagnosis not present

## 2018-01-12 DIAGNOSIS — R05 Cough: Secondary | ICD-10-CM | POA: Diagnosis not present

## 2018-01-12 DIAGNOSIS — D696 Thrombocytopenia, unspecified: Secondary | ICD-10-CM | POA: Diagnosis not present

## 2018-01-12 DIAGNOSIS — Z889 Allergy status to unspecified drugs, medicaments and biological substances status: Secondary | ICD-10-CM | POA: Diagnosis not present

## 2018-01-12 DIAGNOSIS — Z23 Encounter for immunization: Secondary | ICD-10-CM | POA: Diagnosis not present

## 2018-01-17 DIAGNOSIS — M810 Age-related osteoporosis without current pathological fracture: Secondary | ICD-10-CM | POA: Diagnosis not present

## 2018-02-15 DIAGNOSIS — M545 Low back pain: Secondary | ICD-10-CM | POA: Diagnosis not present

## 2018-02-15 DIAGNOSIS — G5702 Lesion of sciatic nerve, left lower limb: Secondary | ICD-10-CM | POA: Diagnosis not present

## 2018-03-05 DIAGNOSIS — R69 Illness, unspecified: Secondary | ICD-10-CM | POA: Diagnosis not present

## 2018-03-05 DIAGNOSIS — K006 Disturbances in tooth eruption: Secondary | ICD-10-CM | POA: Diagnosis not present

## 2018-03-11 DIAGNOSIS — T50905A Adverse effect of unspecified drugs, medicaments and biological substances, initial encounter: Secondary | ICD-10-CM | POA: Diagnosis not present

## 2018-03-11 DIAGNOSIS — M2550 Pain in unspecified joint: Secondary | ICD-10-CM | POA: Diagnosis not present

## 2018-03-11 DIAGNOSIS — R042 Hemoptysis: Secondary | ICD-10-CM | POA: Diagnosis not present

## 2018-03-11 DIAGNOSIS — R05 Cough: Secondary | ICD-10-CM | POA: Diagnosis not present

## 2018-03-11 DIAGNOSIS — K210 Gastro-esophageal reflux disease with esophagitis: Secondary | ICD-10-CM | POA: Diagnosis not present

## 2018-03-21 DIAGNOSIS — M2550 Pain in unspecified joint: Secondary | ICD-10-CM | POA: Diagnosis not present

## 2018-03-21 DIAGNOSIS — G8929 Other chronic pain: Secondary | ICD-10-CM | POA: Diagnosis not present

## 2018-03-21 DIAGNOSIS — M1712 Unilateral primary osteoarthritis, left knee: Secondary | ICD-10-CM | POA: Diagnosis not present

## 2018-03-21 DIAGNOSIS — M170 Bilateral primary osteoarthritis of knee: Secondary | ICD-10-CM | POA: Diagnosis not present

## 2018-03-21 DIAGNOSIS — M7542 Impingement syndrome of left shoulder: Secondary | ICD-10-CM | POA: Diagnosis not present

## 2018-03-21 DIAGNOSIS — M1711 Unilateral primary osteoarthritis, right knee: Secondary | ICD-10-CM | POA: Diagnosis not present

## 2018-03-21 DIAGNOSIS — M7541 Impingement syndrome of right shoulder: Secondary | ICD-10-CM | POA: Diagnosis not present

## 2018-03-21 DIAGNOSIS — M8589 Other specified disorders of bone density and structure, multiple sites: Secondary | ICD-10-CM | POA: Diagnosis not present

## 2018-04-03 DIAGNOSIS — R200 Anesthesia of skin: Secondary | ICD-10-CM | POA: Diagnosis not present

## 2018-04-03 DIAGNOSIS — M170 Bilateral primary osteoarthritis of knee: Secondary | ICD-10-CM | POA: Diagnosis not present

## 2018-04-03 DIAGNOSIS — R05 Cough: Secondary | ICD-10-CM | POA: Diagnosis not present

## 2018-04-03 DIAGNOSIS — M810 Age-related osteoporosis without current pathological fracture: Secondary | ICD-10-CM | POA: Diagnosis not present

## 2018-04-03 DIAGNOSIS — M2550 Pain in unspecified joint: Secondary | ICD-10-CM | POA: Diagnosis not present

## 2018-04-03 DIAGNOSIS — D691 Qualitative platelet defects: Secondary | ICD-10-CM | POA: Diagnosis not present

## 2018-04-08 DIAGNOSIS — R200 Anesthesia of skin: Secondary | ICD-10-CM | POA: Diagnosis not present

## 2018-04-08 DIAGNOSIS — R252 Cramp and spasm: Secondary | ICD-10-CM | POA: Diagnosis not present

## 2018-04-29 DIAGNOSIS — K219 Gastro-esophageal reflux disease without esophagitis: Secondary | ICD-10-CM | POA: Diagnosis not present

## 2018-04-29 DIAGNOSIS — Z131 Encounter for screening for diabetes mellitus: Secondary | ICD-10-CM | POA: Diagnosis not present

## 2018-04-29 DIAGNOSIS — R072 Precordial pain: Secondary | ICD-10-CM | POA: Diagnosis not present

## 2018-05-08 DIAGNOSIS — M25511 Pain in right shoulder: Secondary | ICD-10-CM | POA: Diagnosis not present

## 2018-05-10 DIAGNOSIS — E7800 Pure hypercholesterolemia, unspecified: Secondary | ICD-10-CM | POA: Diagnosis not present

## 2018-05-10 DIAGNOSIS — I447 Left bundle-branch block, unspecified: Secondary | ICD-10-CM | POA: Diagnosis not present

## 2018-05-10 DIAGNOSIS — R079 Chest pain, unspecified: Secondary | ICD-10-CM | POA: Diagnosis not present

## 2018-05-10 DIAGNOSIS — N644 Mastodynia: Secondary | ICD-10-CM | POA: Diagnosis not present

## 2018-05-10 DIAGNOSIS — I10 Essential (primary) hypertension: Secondary | ICD-10-CM | POA: Diagnosis not present

## 2018-05-24 DIAGNOSIS — I10 Essential (primary) hypertension: Secondary | ICD-10-CM | POA: Diagnosis not present

## 2018-05-24 DIAGNOSIS — R0789 Other chest pain: Secondary | ICD-10-CM | POA: Diagnosis not present

## 2018-05-24 DIAGNOSIS — M1990 Unspecified osteoarthritis, unspecified site: Secondary | ICD-10-CM | POA: Diagnosis not present

## 2018-06-07 DIAGNOSIS — R0789 Other chest pain: Secondary | ICD-10-CM | POA: Diagnosis not present

## 2018-07-04 DIAGNOSIS — I10 Essential (primary) hypertension: Secondary | ICD-10-CM | POA: Diagnosis not present

## 2018-07-04 DIAGNOSIS — R079 Chest pain, unspecified: Secondary | ICD-10-CM | POA: Diagnosis not present

## 2018-07-10 DIAGNOSIS — R7309 Other abnormal glucose: Secondary | ICD-10-CM | POA: Diagnosis not present

## 2018-07-10 DIAGNOSIS — D691 Qualitative platelet defects: Secondary | ICD-10-CM | POA: Diagnosis not present

## 2018-07-10 DIAGNOSIS — D708 Other neutropenia: Secondary | ICD-10-CM | POA: Diagnosis not present

## 2018-07-10 DIAGNOSIS — I10 Essential (primary) hypertension: Secondary | ICD-10-CM | POA: Diagnosis not present

## 2018-08-26 DIAGNOSIS — M810 Age-related osteoporosis without current pathological fracture: Secondary | ICD-10-CM | POA: Diagnosis not present

## 2018-09-14 IMAGING — CR DG ELBOW 2V*L*
2 series · 2 of 2 positions shown · non-contrast
Comparison: None

CLINICAL DATA: Recent fall with left elbow pain, initial encounter

EXAM:
LEFT ELBOW - 2 VIEW

[elbow ap]
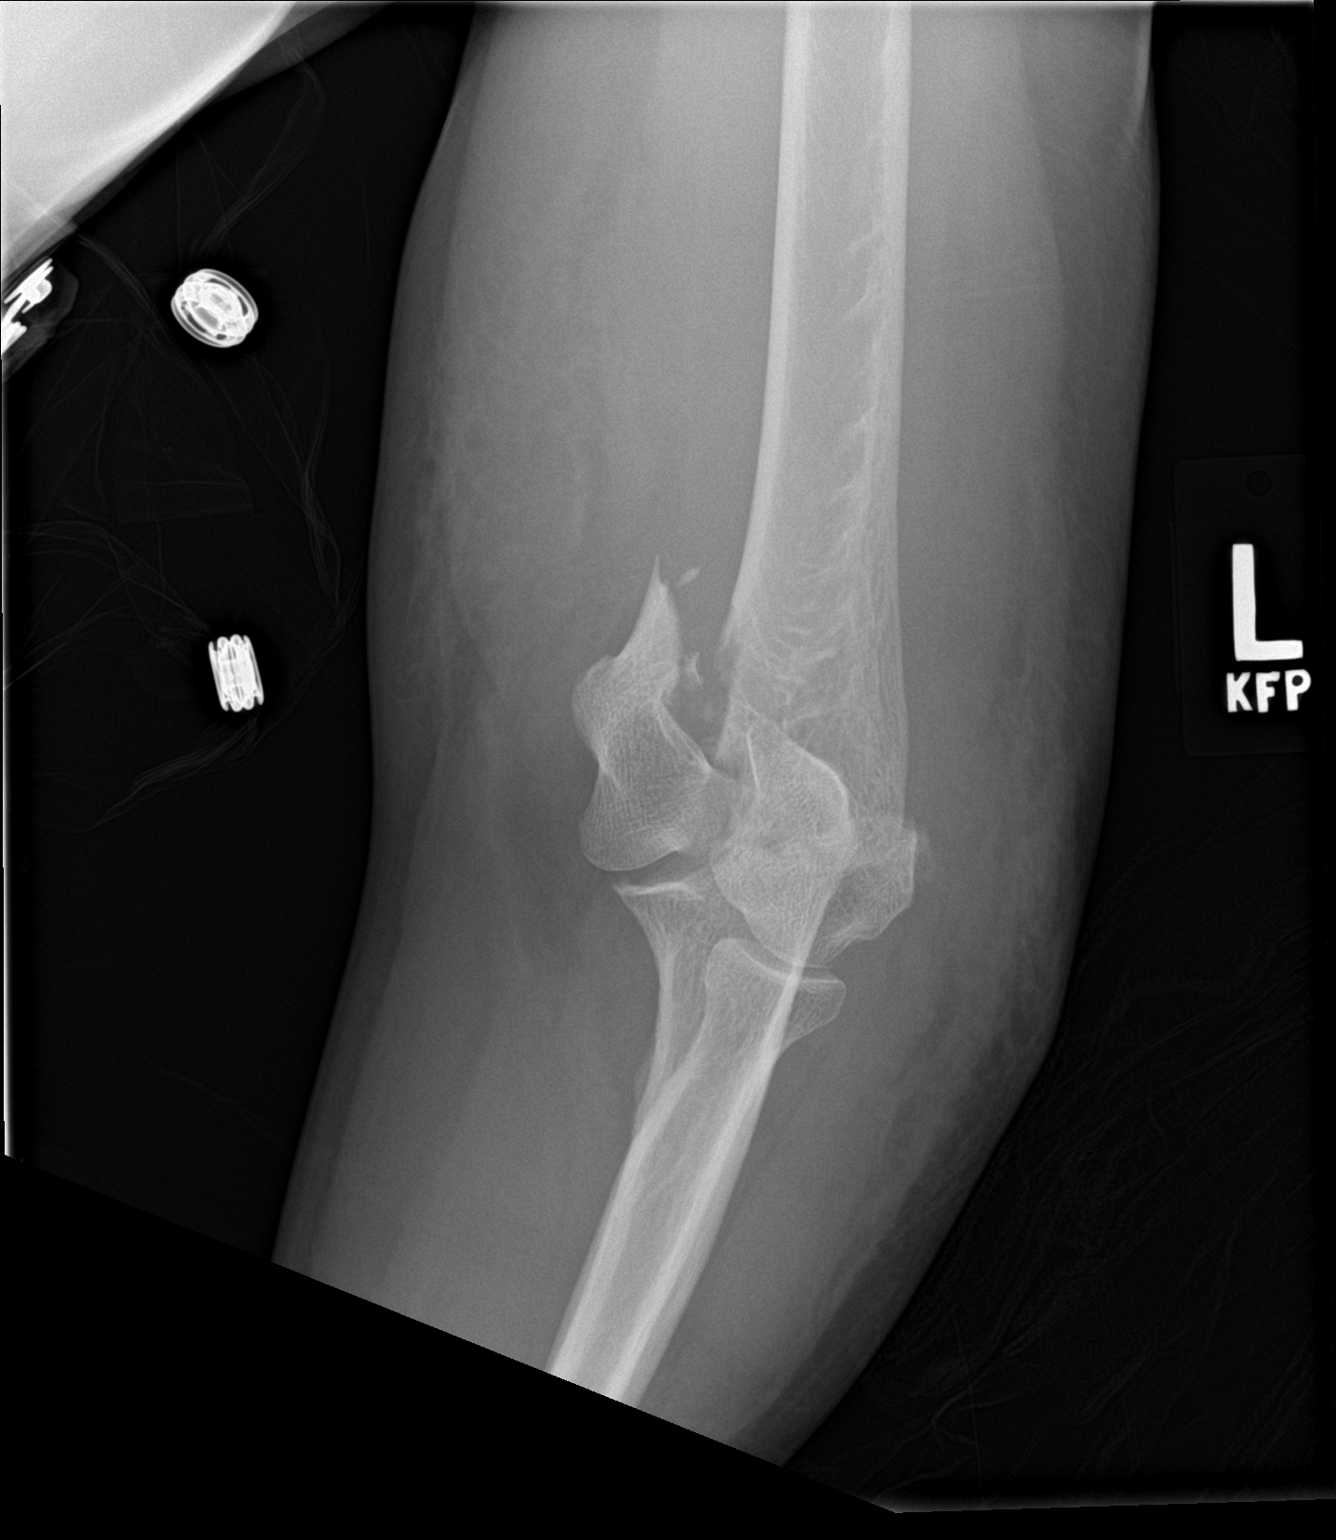

[elbow lat]
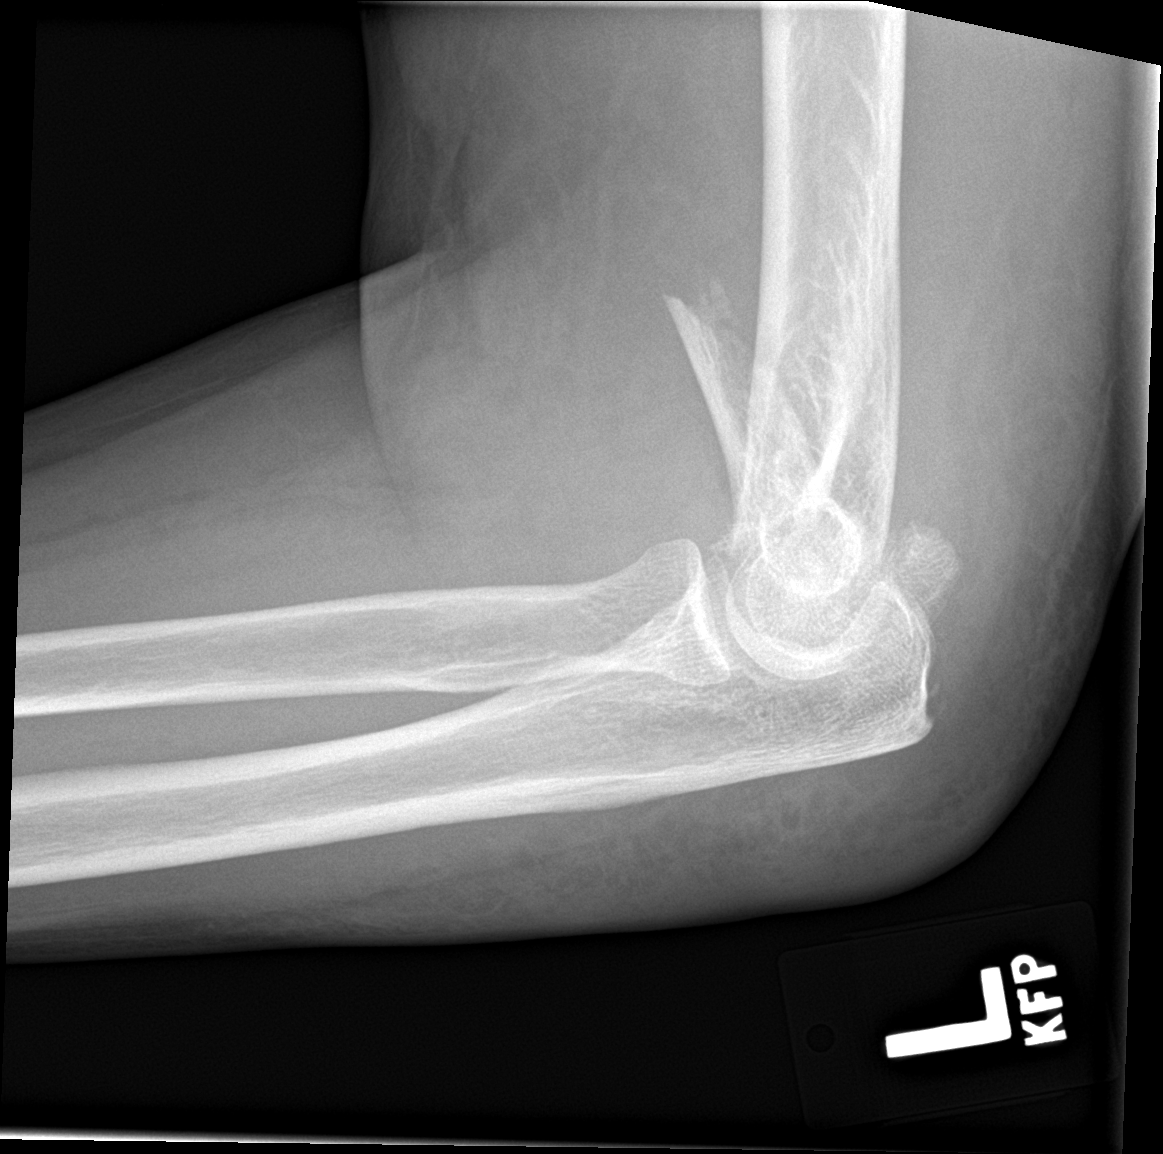

[2 of 2 positions shown; findings below may reference images not displayed]

FINDINGS: Comminuted fracture of the distal left humerus is noted with
impaction at the fracture site. Fracture lines extend into joint
space. No dislocation is noted. Considerable soft tissue swelling is
noted.
IMPRESSION: Comminuted distal left humeral fracture with impaction at the
fracture site in extension into the articular surface.

## 2018-09-14 IMAGING — CT CT CERVICAL SPINE W/O CM
2 of 14 series · 5 of 33 positions shown, 6 images · non-contrast
Comparison: MRI of the brain 08/02/2009 common cervical spine MRI
09/14/2008

ADDENDUM:
There is an acute appearing left transverse process fracture T1.
CLINICAL DATA: Posttraumatic fall from attic today with loss of
consciousness.

EXAM:
CT HEAD WITHOUT CONTRAST
CT MAXILLOFACIAL WITHOUT CONTRAST
CT CERVICAL SPINE WITHOUT CONTRAST
TECHNIQUE: Multidetector CT imaging of the head, cervical spine, and
maxillofacial structures were performed using the standard protocol
without intravenous contrast. Multiplanar CT image reconstructions
of the cervical spine and maxillofacial structures were also
generated.

[Series 10: maxilllofacial 2.0 hr40 3 · axial · 0.36mm/px · z∈[-174,+12]mm · 3 of 94 slices shown, 4 images]
[im 1/94  soft-tissue]
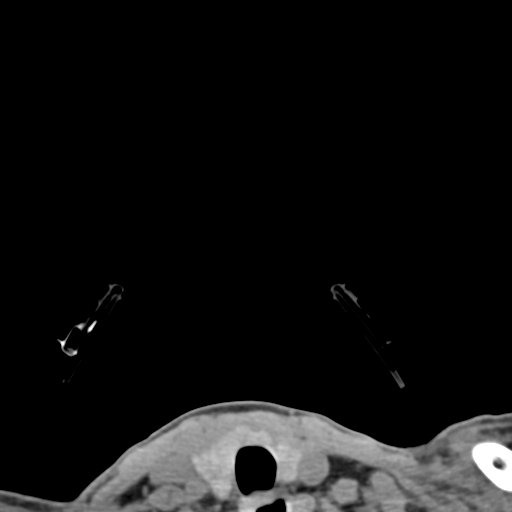
[im 1/94  bone]
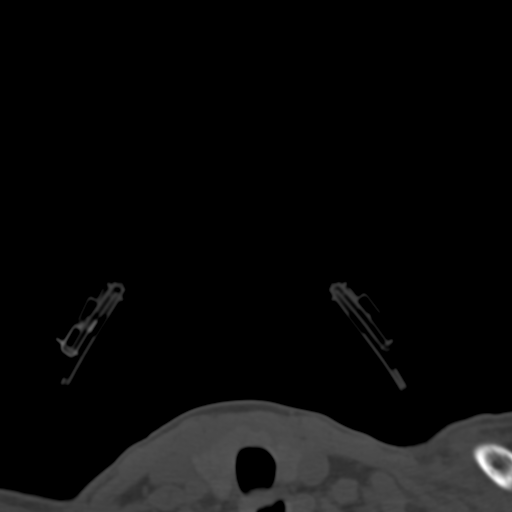
[im 47/94  bone]
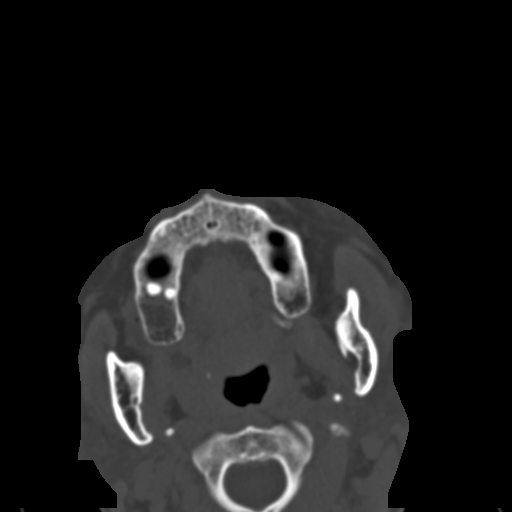
[im 94/94  bone]
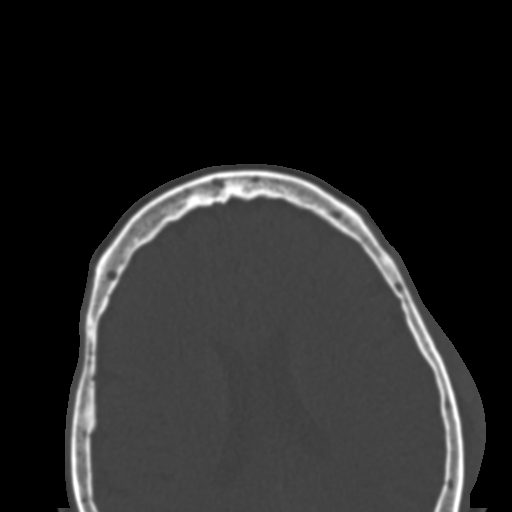

[Series 15: st sag · sagittal · 0.38mm/px · 2 of 76 slices shown]
[im 26/76  bone]
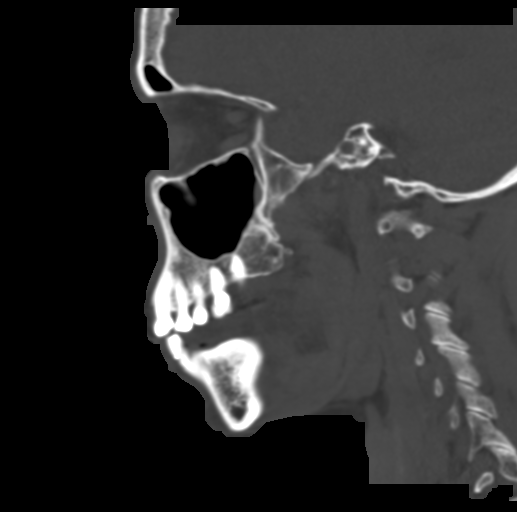
[im 51/76  bone]
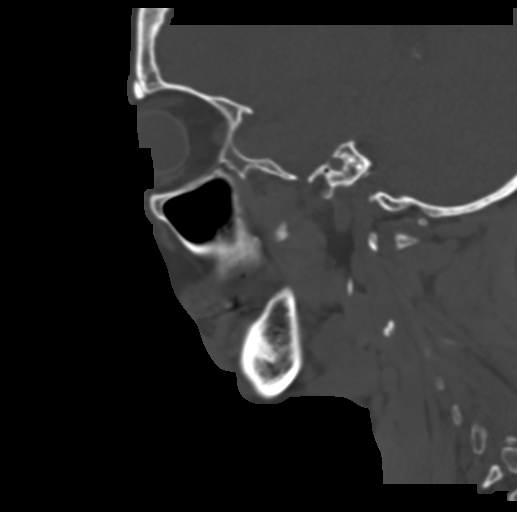

[5 of 33 positions shown; findings below may reference images not displayed]

FINDINGS: CT HEAD FINDINGS

Brain: Mild superficial atrophy. No acute intracranial hemorrhage,
midline shift or edema. No intra-axial mass nor extra-axial fluid
collections. No hydrocephalus. No effacement of the basal cisterns
or fourth ventricle. Minimal small vessel ischemic changes of
periventricular white matter bilaterally.

Vascular: No hyperdense vessels. Mild atherosclerosis of the carotid
siphons right greater than left.

Skull: No skull fracture or suspicious osseous lesions.

Other: None

CT MAXILLOFACIAL FINDINGS

Osseous: No acute maxillofacial fracture.

Orbits: Intact orbits and globes. No retrobulbar hematoma. Bilateral
cataract extractions.

Sinuses: Trace air-fluid level within the left sphenoid sinus with
mild ethmoid and right maxillary sinus mucosal thickening.

Soft tissues: Left premalar soft tissue contusion.

CT CERVICAL SPINE FINDINGS

Alignment: Slight straightening of cervical lordosis which may be
due to positioning or muscle spasm. Intact atlantodental interval
and craniocervical relationship.

Skull base and vertebrae: No acute fracture. No primary bone lesion
or focal pathologic process.

Soft tissues and spinal canal: No prevertebral fluid or swelling. No
visible canal hematoma.

Disc levels: Moderate disc space narrowing at C6-7 with small
posterior marginal osteophytes and uncovertebral joint
osteoarthritis. Slight left-sided neural foraminal encroachment from
uncovertebral joint spurring at C6-7.

Upper chest: Negative.

Other: None
IMPRESSION: 1. Minimal small vessel ischemic disease of periventricular white
matter. No acute intracranial abnormality.
2. Soft tissue contusion of the left cheek. No maxillofacial
fracture.
3. C6-7 degenerative disc disease and uncovertebral joint
osteoarthritis. No acute cervical spine fracture or posttraumatic
cervical subluxation.

## 2018-09-20 IMAGING — RF DG C-ARM 61-120 MIN
1 series · 9 of 9 positions shown · non-contrast
Comparison: Plain films 02/17/2017.

CLINICAL DATA: Distal humerus fracture.

EXAM:
DG C-ARM 61-120 MIN; LEFT ELBOW - COMPLETE 3+ VIEW

[Series 1: run · 9 of 9 slices shown]
[im 1/9]
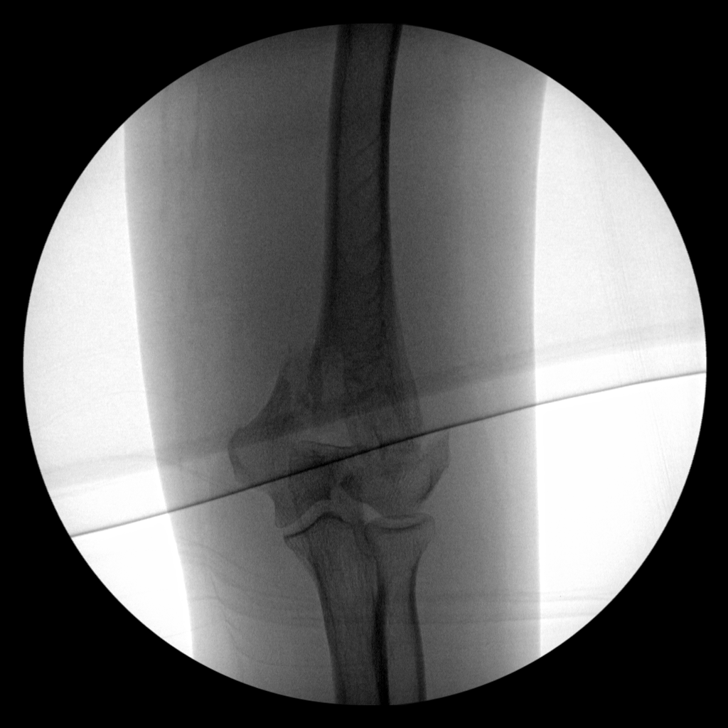
[im 2/9]
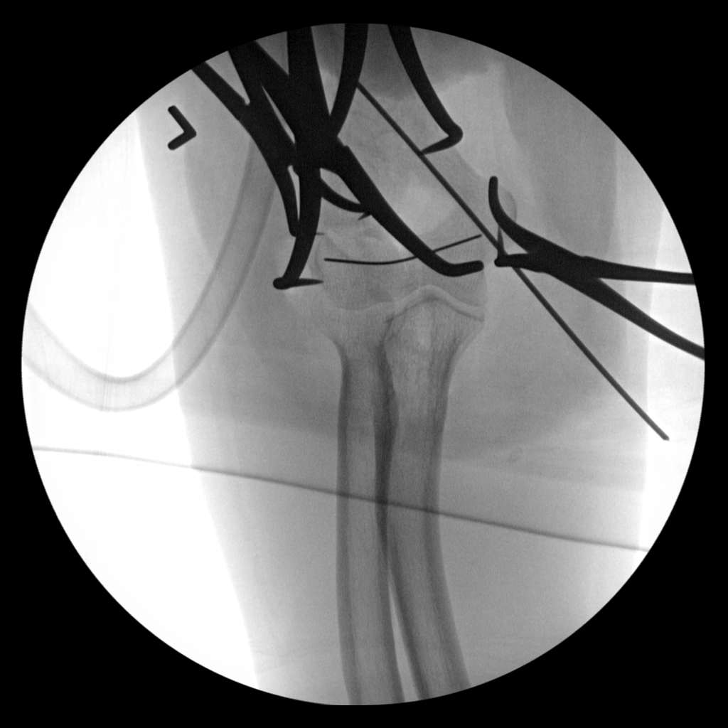
[im 3/9]
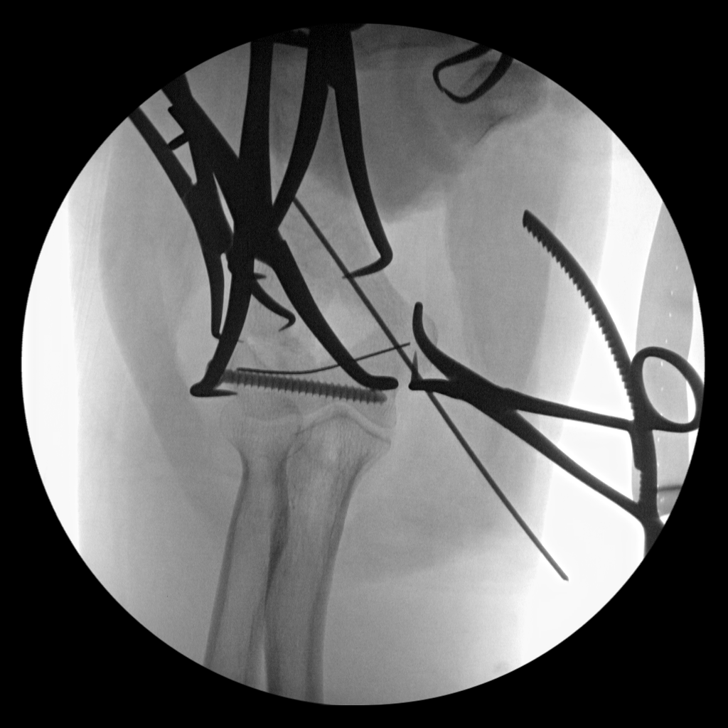
[im 4/9]
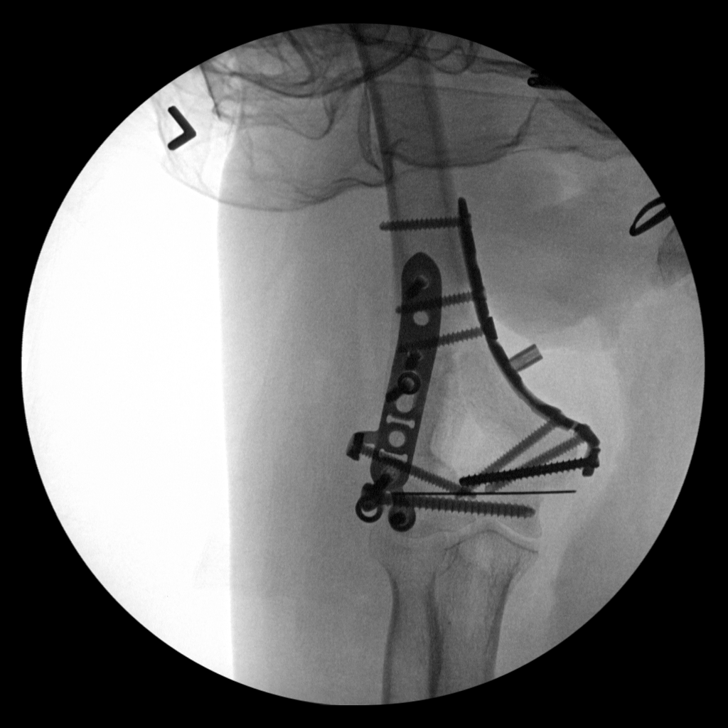
[im 5/9]
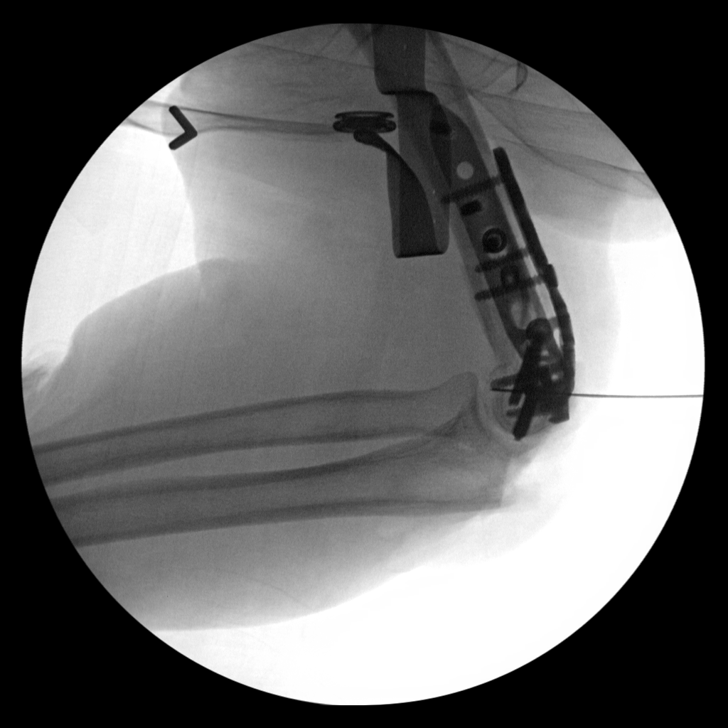
[im 6/9]
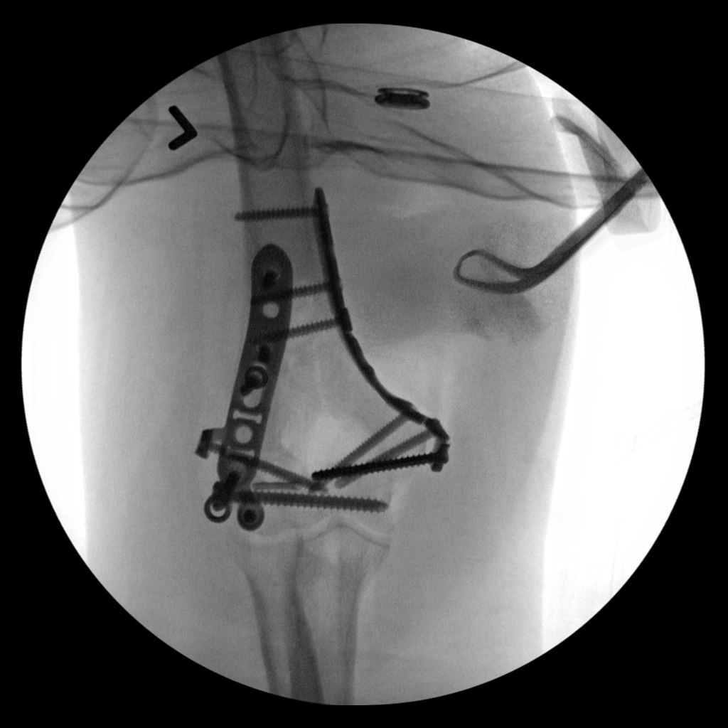
[im 7/9]
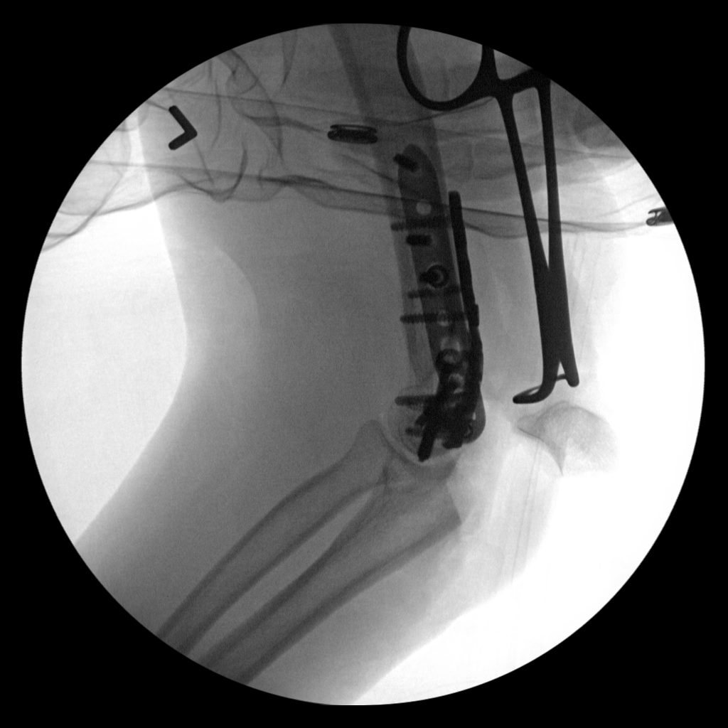
[im 8/9]
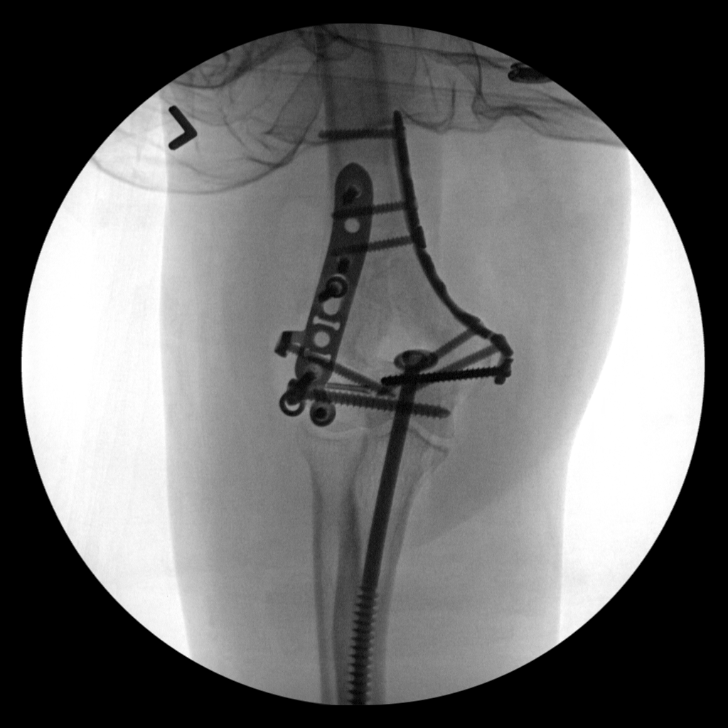
[im 9/9]
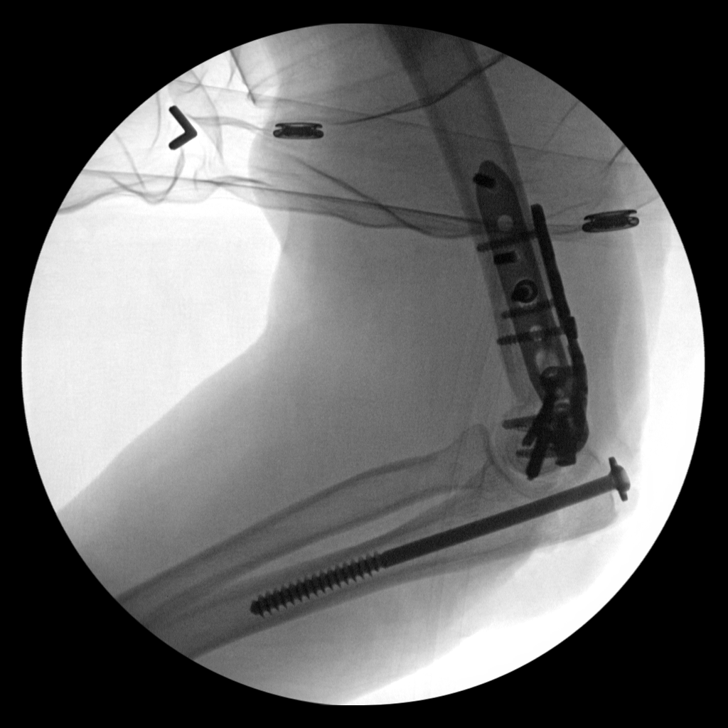

[9 of 9 positions shown; findings below may reference images not displayed]

FINDINGS: Multiple plate and screw construct across a Y type distal humerus
fracture, with intra-articular extension. A cannulated screw is also
been placed across the distal ulna.
IMPRESSION: Improved position and alignment.

## 2018-09-20 IMAGING — DX DG ELBOW 2V*L*
2 series · 2 of 2 positions shown · non-contrast
Comparison: 02/17/2017 and earlier today.

CLINICAL DATA: Postop check due to left elbow pain and decreased
range of motion.

EXAM:
LEFT ELBOW - 2 VIEW

[elbow ap]
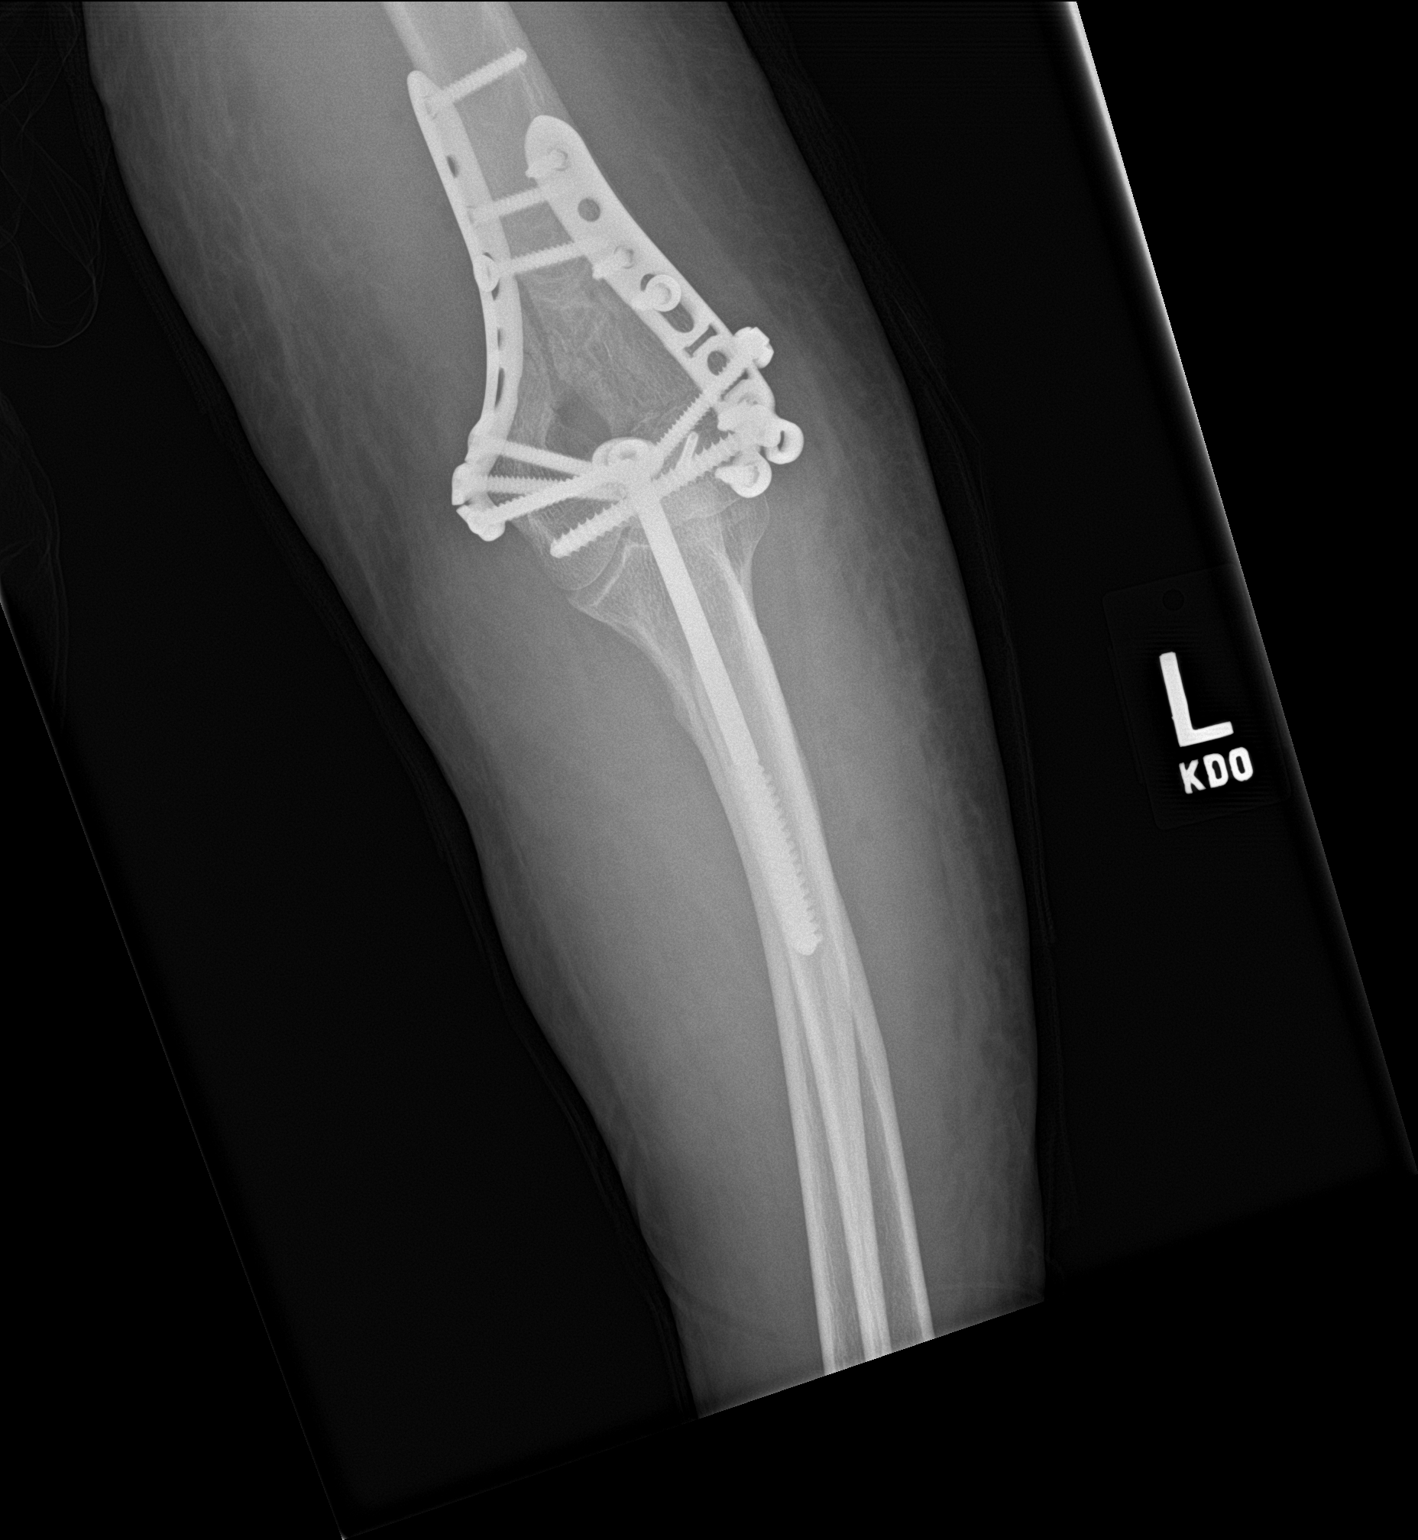

[elbow lat]
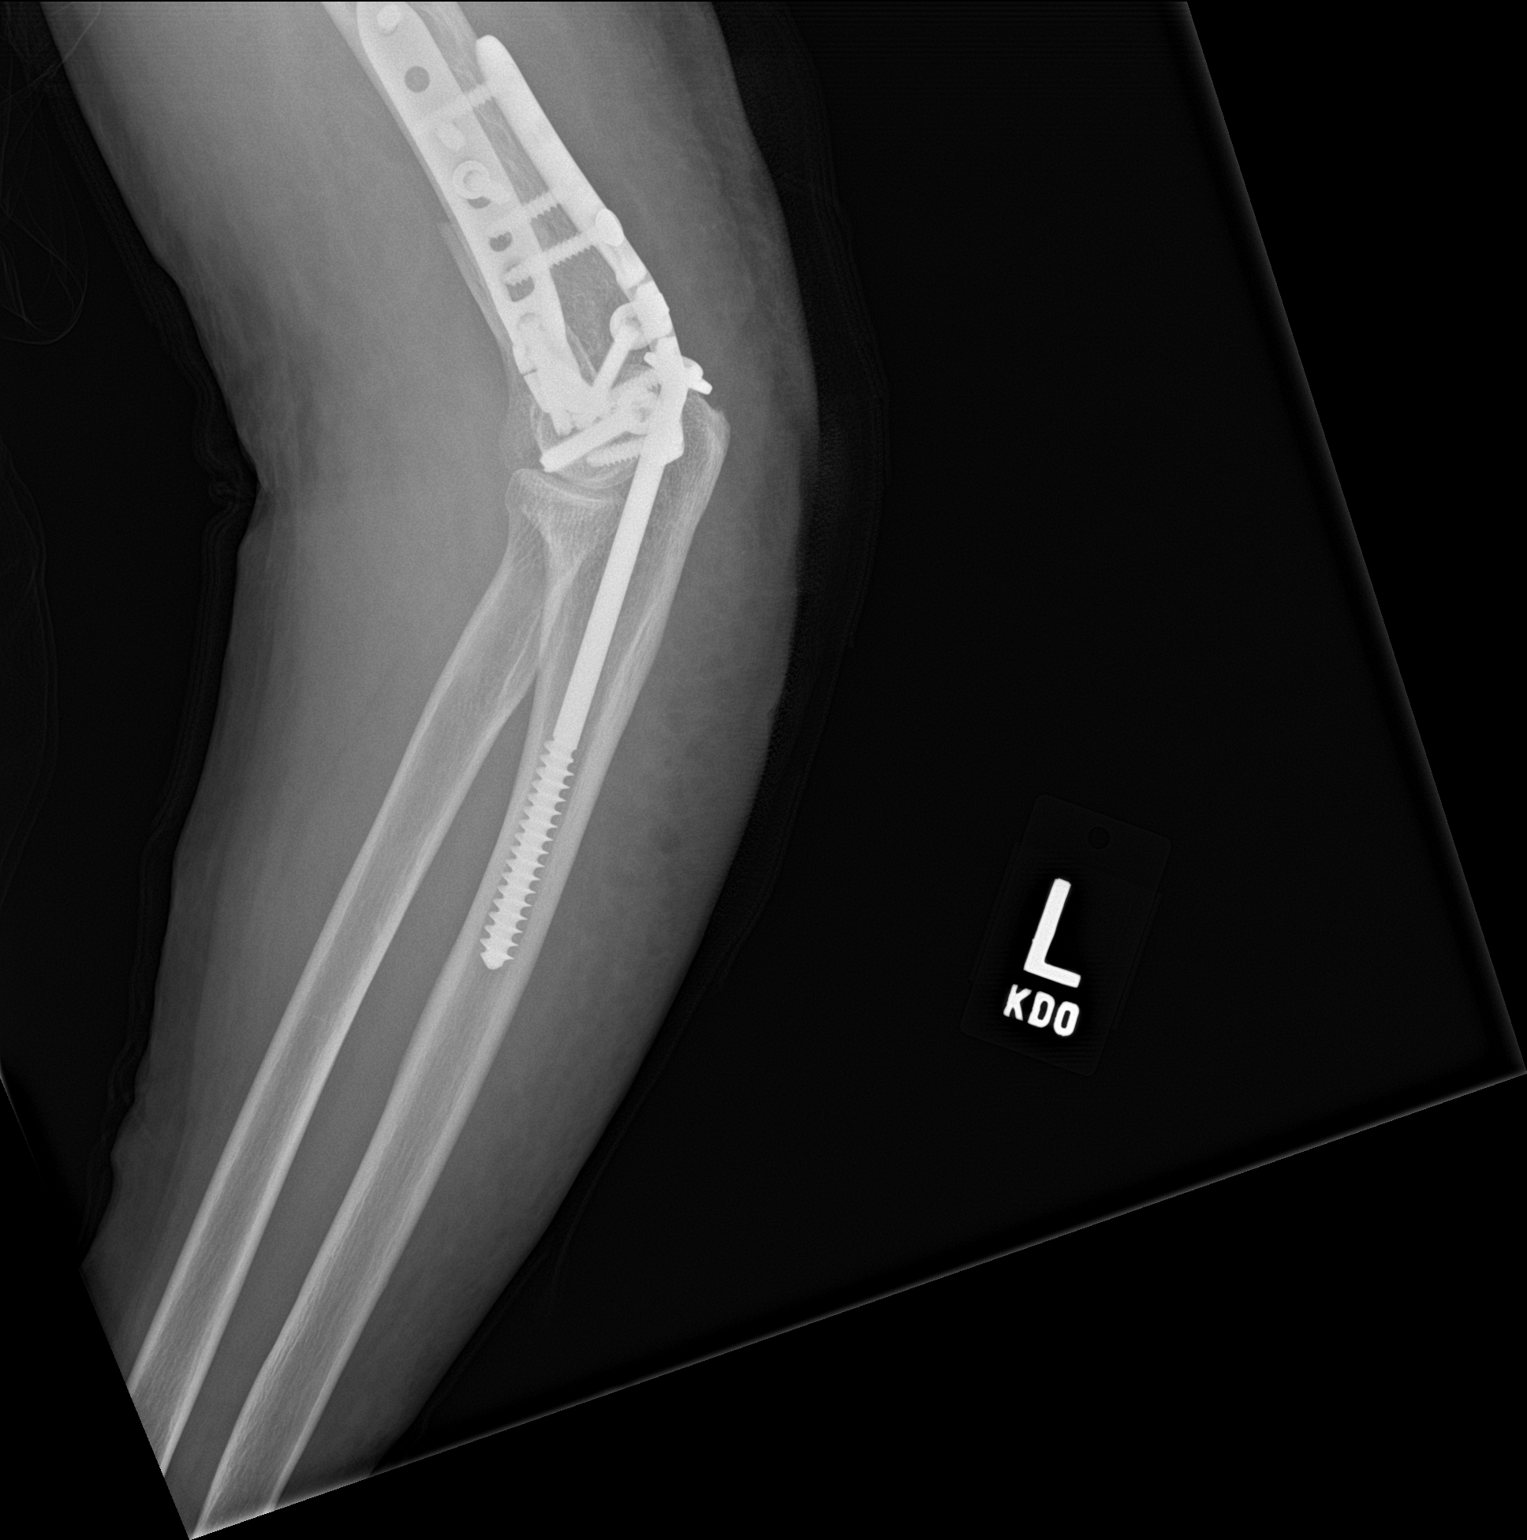

[2 of 2 positions shown; findings below may reference images not displayed]

FINDINGS: Examination demonstrates fixation hardware over patient's left elbow
fracture intact with anatomic alignment over the fracture site.
IMPRESSION: Near anatomic alignment over patient's left elbow fracture post
fixation with hardware intact.

## 2018-12-18 ENCOUNTER — Other Ambulatory Visit: Payer: Self-pay | Admitting: Family Medicine

## 2018-12-18 DIAGNOSIS — Z1231 Encounter for screening mammogram for malignant neoplasm of breast: Secondary | ICD-10-CM

## 2019-01-16 ENCOUNTER — Telehealth: Payer: Self-pay | Admitting: Obstetrics & Gynecology

## 2019-01-16 NOTE — Telephone Encounter (Signed)
[facility] referring for CIN 2. Attempt to reach patient no voicemail set up. MD only

## 2019-01-22 ENCOUNTER — Other Ambulatory Visit: Payer: Self-pay | Admitting: Family Medicine

## 2019-01-22 DIAGNOSIS — Z1231 Encounter for screening mammogram for malignant neoplasm of breast: Secondary | ICD-10-CM

## 2019-01-23 NOTE — Telephone Encounter (Signed)
Called and left voicemail for patient to call back to be schedule. Appointment was schedule and cancelled due to provider schedule change unable to reach patient 01/22/19. Patient mobile number voicemail box is full unable to leave message.

## 2019-01-27 NOTE — Telephone Encounter (Signed)
Voicemail is full unable to leave message

## 2019-01-27 NOTE — Telephone Encounter (Signed)
Contacted [facility] spoke with Tammy about multiple attempts to reach patient were unsuccessful.

## 2019-01-27 NOTE — Telephone Encounter (Signed)
Voicemail not set up unable to leave message  

## 2019-01-30 NOTE — Telephone Encounter (Signed)
Called and left voice mail for patient to call back to be schedule °

## 2019-01-31 ENCOUNTER — Ambulatory Visit: Payer: Medicaid Other | Admitting: Obstetrics and Gynecology

## 2019-02-03 NOTE — Telephone Encounter (Signed)
Voicemail box not set up unable to leave message.  

## 2019-02-04 NOTE — Telephone Encounter (Signed)
Patient is schedule for 02/24/19 at 9:30

## 2019-02-10 ENCOUNTER — Encounter: Payer: Self-pay | Admitting: Internal Medicine

## 2019-02-10 ENCOUNTER — Other Ambulatory Visit: Payer: Self-pay

## 2019-02-11 ENCOUNTER — Other Ambulatory Visit: Payer: Self-pay

## 2019-02-11 ENCOUNTER — Inpatient Hospital Stay: Payer: Medicare HMO | Attending: Internal Medicine | Admitting: Internal Medicine

## 2019-02-11 ENCOUNTER — Encounter: Payer: Self-pay | Admitting: Internal Medicine

## 2019-02-11 ENCOUNTER — Inpatient Hospital Stay: Payer: Medicare HMO

## 2019-02-11 VITALS — BP 126/88 | HR 88 | Temp 98.2°F | Resp 18 | Wt 150.0 lb

## 2019-02-11 DIAGNOSIS — I10 Essential (primary) hypertension: Secondary | ICD-10-CM | POA: Diagnosis not present

## 2019-02-11 DIAGNOSIS — Z90710 Acquired absence of both cervix and uterus: Secondary | ICD-10-CM | POA: Diagnosis not present

## 2019-02-11 DIAGNOSIS — D696 Thrombocytopenia, unspecified: Secondary | ICD-10-CM | POA: Diagnosis not present

## 2019-02-11 DIAGNOSIS — Z90721 Acquired absence of ovaries, unilateral: Secondary | ICD-10-CM | POA: Diagnosis not present

## 2019-02-11 DIAGNOSIS — D709 Neutropenia, unspecified: Secondary | ICD-10-CM

## 2019-02-11 DIAGNOSIS — K5900 Constipation, unspecified: Secondary | ICD-10-CM | POA: Diagnosis not present

## 2019-02-11 DIAGNOSIS — Z79899 Other long term (current) drug therapy: Secondary | ICD-10-CM | POA: Insufficient documentation

## 2019-02-11 DIAGNOSIS — M8580 Other specified disorders of bone density and structure, unspecified site: Secondary | ICD-10-CM | POA: Diagnosis not present

## 2019-02-11 LAB — CBC WITH DIFFERENTIAL/PLATELET
Abs Immature Granulocytes: 0.01 10*3/uL (ref 0.00–0.07)
Basophils Absolute: 0 10*3/uL (ref 0.0–0.1)
Basophils Relative: 1 %
Eosinophils Absolute: 0.1 10*3/uL (ref 0.0–0.5)
Eosinophils Relative: 2 %
HCT: 41.6 % (ref 36.0–46.0)
Hemoglobin: 13.8 g/dL (ref 12.0–15.0)
Immature Granulocytes: 0 %
Lymphocytes Relative: 55 %
Lymphs Abs: 2.2 10*3/uL (ref 0.7–4.0)
MCH: 29.1 pg (ref 26.0–34.0)
MCHC: 33.2 g/dL (ref 30.0–36.0)
MCV: 87.6 fL (ref 80.0–100.0)
Monocytes Absolute: 0.3 10*3/uL (ref 0.1–1.0)
Monocytes Relative: 8 %
Neutro Abs: 1.4 10*3/uL — ABNORMAL LOW (ref 1.7–7.7)
Neutrophils Relative %: 34 %
Platelets: 121 10*3/uL — ABNORMAL LOW (ref 150–400)
RBC: 4.75 MIL/uL (ref 3.87–5.11)
RDW: 16 % — ABNORMAL HIGH (ref 11.5–15.5)
WBC: 4 10*3/uL (ref 4.0–10.5)
nRBC: 0 % (ref 0.0–0.2)

## 2019-02-11 LAB — COMPREHENSIVE METABOLIC PANEL
ALT: 21 U/L (ref 0–44)
AST: 26 U/L (ref 15–41)
Albumin: 4.7 g/dL (ref 3.5–5.0)
Alkaline Phosphatase: 52 U/L (ref 38–126)
Anion gap: 7 (ref 5–15)
BUN: 18 mg/dL (ref 8–23)
CO2: 26 mmol/L (ref 22–32)
Calcium: 9.8 mg/dL (ref 8.9–10.3)
Chloride: 107 mmol/L (ref 98–111)
Creatinine, Ser: 0.85 mg/dL (ref 0.44–1.00)
GFR calc Af Amer: >60 mL/min (ref >60)
GFR calc non Af Amer: >60 mL/min (ref >60)
Glucose, Bld: 104 mg/dL — ABNORMAL HIGH (ref 70–99)
Potassium: 3.7 mmol/L (ref 3.5–5.1)
Sodium: 140 mmol/L (ref 135–145)
Total Bilirubin: 1.1 mg/dL (ref 0.3–1.2)
Total Protein: 8.2 g/dL — ABNORMAL HIGH (ref 6.5–8.1)

## 2019-02-11 LAB — VITAMIN B12: Vitamin B-12: 971 pg/mL — ABNORMAL HIGH (ref 180–914)

## 2019-02-11 LAB — LACTATE DEHYDROGENASE: LDH: 171 U/L (ref 98–192)

## 2019-02-11 LAB — HEPATITIS B SURFACE ANTIGEN: Hepatitis B Surface Ag: NONREACTIVE

## 2019-02-11 LAB — HEPATITIS C ANTIBODY: HCV Ab: NONREACTIVE

## 2019-02-11 LAB — FOLATE: Folate: 32 ng/mL (ref >5.9)

## 2019-02-11 LAB — C-REACTIVE PROTEIN: CRP: <0.8 mg/dL (ref <1.0)

## 2019-02-11 NOTE — Progress Notes (Signed)
New patient visit for thrombocytopenia and WBC issues.

## 2019-02-11 NOTE — Assessment & Plan Note (Signed)
#  Chronic neutropenia- [2017 at least]; ANC 1.0- 1.4.  No infections.  No offending drugs noted.  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  Recommend monitor for now.  #Thrombocytopenia moderate > 100-11 chronic asymptomatic.  Question ITP versus other causes.   #Discussed with patient the above possible etiologies.  However if her blood counts get worse/or she starts develop anemia I would recommend further work-up including a bone marrow biopsy etc.  #Intermittent constipation-colonoscopy 2 years ago/[facility].  Recommend MiraLAX if worse recommend repeat evaluation with GI   Recommend checking CBC;CMP WITH LDH; monoclonal workup. Check peripheral smear. Hepatitis workup. N30 and folic acid.  Thank you Dr.Aldridge for allowing me to participate in the care of your pleasant patient. Please do not hesitate to contact me with questions or concerns in the interim.  # DISPOSITION: # labs today [ordered] # Follow up in 4 months- cbc/cmp/LDH- Dr.B

## 2019-02-11 NOTE — Progress Notes (Signed)
[facility] NOTE  Patient Care Team: Gayland Curry, MD as PCP - General (Family Medicine)  CHIEF COMPLAINTS/PURPOSE OF CONSULTATION: Thrombocytopenia   HEMATOLOGY HISTORY  # THROMBOCYTOPENIA/NEUTROPENIA [chronic 2017-platelets> 100 ]; WBC- ANC1.0- chronic/2017 ; Hb-13]; CT scan-2018 abdomen pelvis-negative for splenomegaly/cirrhosis.  # Colonoscopy- 2018;[facility]  HISTORY OF PRESENTING ILLNESS:  Lori Deleon 73 y.o.  female pleasant patient of African descent was been referred to [place] for further evaluation of thrombocytopenia/neutropenia.  Patient noted to have abnormal blood counts incidentally on blood work.  Labs from PCPs office reviewed/summarized as above  Patient denies any recent/or multiple infections needing antibiotics.  She denies any skin lesions.  Denies any easy bruising or bleeding.  Patient denies any alcohol.  Intermittent constipation.  No blood in stools or black or stools.   Review of Systems  Constitutional: Positive for malaise/fatigue. Negative for chills, diaphoresis, fever and weight loss.  HENT: Negative for nosebleeds and sore throat.   Eyes: Negative for double vision.  Respiratory: Negative for cough, hemoptysis, sputum production, shortness of breath and wheezing.   Cardiovascular: Negative for chest pain, palpitations, orthopnea and leg swelling.  Gastrointestinal: Negative for abdominal pain, blood in stool, constipation, diarrhea, heartburn, melena, nausea and vomiting.  Genitourinary: Negative for dysuria, frequency and urgency.  Musculoskeletal: Positive for back pain and joint pain.  Skin: Negative.  Negative for itching and rash.  Neurological: Negative for dizziness, tingling, focal weakness, weakness and headaches.  Endo/Heme/Allergies: Does not bruise/bleed easily.  Psychiatric/Behavioral: Negative for depression. The patient is not nervous/anxious and does not have insomnia.      MEDICAL HISTORY:  Past  Medical History:  Diagnosis Date  . Arthritis   . CIN II (cervical intraepithelial neoplasia II)   . Dyspnea    with exertion  . Esophageal reflux   . GERD (gastroesophageal reflux disease)   . Hyperglycemia   . Hypertension   . Intermittent chest pain   . Osteopenia   . UTI (lower urinary tract infection)     SURGICAL HISTORY: Past Surgical History:  Procedure Laterality Date  . ABDOMINAL HYSTERECTOMY    . BREAST BIOPSY Left 1987   neg  . OOPHORECTOMY    . OPEN REDUCTION INTERNAL FIXATION (ORIF) DISTAL RADIAL FRACTURE Right 02/23/2017   Procedure: OPEN REDUCTION INTERNAL FIXATION (ORIF) DISTAL RADIAL FRACTURE, right;  Surgeon: Charlotte Crumb, MD;  Location: [facility];  Service: Orthopedics;  Laterality: Right;  . ORIF HUMERUS FRACTURE Left 02/23/2017   Procedure: OPEN REDUCTION INTERNAL FIXATION (ORIF) DISTAL HUMERUS FRACTURE;  Surgeon: Shona Needles, MD;  Location: [facility];  Service: Orthopedics;  Laterality: Left;    SOCIAL HISTORY: Social History   Socioeconomic History  . Marital status: Widowed    Spouse name: Not on file  . Number of children: Not on file  . Years of education: Not on file  . Highest education level: Not on file  Occupational History  . Not on file  Social Needs  . Financial resource strain: Not on file  . Food insecurity    Worry: Not on file    Inability: Not on file  . Transportation needs    Medical: Not on file    Non-medical: Not on file  Tobacco Use  . Smoking status: Never Smoker  . Smokeless tobacco: Never Used  Substance and Sexual Activity  . Alcohol use: No  . Drug use: No  . Sexual activity: Not on file  Lifestyle  . Physical activity    Days per  week: Not on file    Minutes per session: Not on file  . Stress: Not on file  Relationships  . Social Herbalist on phone: Not on file    Gets together: Not on file    Attends religious service: Not on file    Active member of club or organization: Not on file     Attends meetings of clubs or organizations: Not on file    Relationship status: Not on file  . Intimate partner violence    Fear of current or ex partner: Not on file    Emotionally abused: Not on file    Physically abused: Not on file    Forced sexual activity: Not on file  Other Topics Concern  . Not on file  Social History Narrative   In [place] [moved from [place]; never smoked; no alcohol; [employer] childcare/day care; from [place].     FAMILY HISTORY: Family History  Problem Relation Age of Onset  . Breast cancer Neg Hx     ALLERGIES:  is allergic to alendronate and sulfa antibiotics.  MEDICATIONS:  Current Outpatient Medications  Medication Sig Dispense Refill  . amLODipine (NORVASC) 10 MG tablet Take 1 tablet by mouth daily.    . calcium carbonate (OS-CAL) 600 MG TABS tablet Take 600 mg by mouth daily with breakfast.    . Camphor-Eucalyptus-Menthol (VICKS VAPORUB) 4.7-1.2-2.6 % OINT Apply 1 application topically daily as needed (COUGH).    . carboxymethylcellulose (REFRESH PLUS) 0.5 % SOLN 1 drop daily as needed.    . Chlorpheniramine-DM (CORICIDIN HBP COUGH/COLD PO) Take 1 tablet by mouth every 12 (twelve) hours as needed.    . Cholecalciferol (VITAMIN D-3) 1000 units CAPS Take 1 capsule by mouth daily.    Marland Kitchen dexlansoprazole (DEXILANT) 60 MG capsule Take 60 mg by mouth daily.    . hydroxypropyl methylcellulose / hypromellose (ISOPTO TEARS / GONIOVISC) 2.5 % ophthalmic solution Place 2 drops into both eyes as needed for dry eyes.    Marland Kitchen ketotifen (ZADITOR) 0.025 % ophthalmic solution Place 1 drop into both eyes daily.    Marland Kitchen losartan (COZAAR) 50 MG tablet Take 50 mg by mouth daily.    . Multiple Vitamin (MULTIVITAMIN) tablet Take 1 tablet by mouth daily.    Marland Kitchen omega-3 fish oil (MAXEPA) 1000 MG CAPS capsule Take 1 capsule by mouth 2 (two) times daily.    Marland Kitchen oxyCODONE (OXY IR/ROXICODONE) 5 MG immediate release tablet Take 1-2 tablets (5-10 mg total) by mouth every 6 (six) hours as  needed for moderate pain. 50 tablet 0  . vitamin C (ASCORBIC ACID) 500 MG tablet Take 500 mg by mouth daily.    . ranitidine (ZANTAC) 150 MG tablet Take 300 mg by mouth at bedtime.     No current facility-administered medications for this visit.      Marland Kitchen  PHYSICAL EXAMINATION:   Vitals:   02/11/19 1128  BP: 126/88  Pulse: 88  Resp: 18  Temp: 98.2 F (36.8 C)  SpO2: 100%   Filed Weights   02/11/19 1128  Weight: 150 lb (68 kg)    Physical Exam  Constitutional: She is oriented to person, place, and time and well-developed, well-nourished, and in no distress.  HENT:  Head: Normocephalic and atraumatic.  Mouth/Throat: Oropharynx is clear and moist. No oropharyngeal exudate.  Eyes: Pupils are equal, round, and reactive to light.  Neck: Normal range of motion. Neck supple.  Cardiovascular: Normal rate and regular rhythm.  Pulmonary/Chest: Effort normal  and breath sounds normal. No respiratory distress. She has no wheezes.  Abdominal: Soft. Bowel sounds are normal. She exhibits no distension and no mass. There is no abdominal tenderness. There is no rebound and no guarding.  Musculoskeletal: Normal range of motion.        General: No tenderness or edema.  Neurological: She is alert and oriented to person, place, and time.  Skin: Skin is warm.  Psychiatric: Affect normal.    LABORATORY DATA:  I have reviewed the data as listed Lab Results  Component Value Date   WBC 4.0 02/11/2019   HGB 13.8 02/11/2019   HCT 41.6 02/11/2019   MCV 87.6 02/11/2019   PLT 121 (L) 02/11/2019   Recent Labs    02/11/19 1217  NA 140  K 3.7  CL 107  CO2 26  GLUCOSE 104*  BUN 18  CREATININE 0.85  CALCIUM 9.8  GFRNONAA >60  GFRAA >60  PROT 8.2*  ALBUMIN 4.7  AST 26  ALT 21  ALKPHOS 52  BILITOT 1.1     No results found.  ASSESSMENT & PLAN:   Neutropenia ([place]) #Chronic neutropenia- [2017 at least]; ANC 1.0- 1.4.  No infections.  No offending drugs noted.  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  Recommend monitor for now.  #Thrombocytopenia moderate > 100-11 chronic asymptomatic.  Question ITP versus other causes.   #Discussed with patient the above possible etiologies.  However if her blood counts get worse/or she starts develop anemia I would recommend further work-up including a bone marrow biopsy etc.  #Intermittent constipation-colonoscopy 2 years ago/[facility].  Recommend MiraLAX if worse recommend repeat evaluation with GI   Recommend checking CBC;CMP WITH LDH; monoclonal workup. Check peripheral smear. Hepatitis workup. V95 and folic acid.  Thank you Dr.Aldridge for allowing me to participate in the care of your pleasant patient. Please do not hesitate to contact me with questions or concerns in the interim.  # DISPOSITION: # labs today [ordered] # Follow up in 4 months- cbc/cmp/LDH- Dr.B   All questions were answered. The patient knows to call the clinic with any problems, questions or concerns.   Cammie Sickle, MD 02/11/2019 2:14 PM   # 45 minutes face-to-face with the patient discussing the above plan of care; more than 50% of time spent on counseling and coordination.

## 2019-02-12 ENCOUNTER — Telehealth: Payer: Self-pay | Admitting: Internal Medicine

## 2019-02-12 NOTE — Telephone Encounter (Signed)
Left a voicemail for the patient that her labs are stable-slightly low ANC; mild thrombocytopenia 120 stable.  Would not recommend any further work-up at this time.  Follow-up as planned

## 2019-02-14 ENCOUNTER — Telehealth: Payer: Self-pay | Admitting: *Deleted

## 2019-02-14 NOTE — Telephone Encounter (Signed)
I called patient and gove her Dr Agnes Lawrence response, She thanked me for calling her back

## 2019-02-14 NOTE — Telephone Encounter (Signed)
Patient called asking for results. Please return her call [phone], her next appointment is not until Feb 2021  CBC with Differential/Platelet Order: [id number] Status:  Final result  Visible to patient:  No (not released)  Next appt:  02/24/2019 at 09:30 AM in Obstetrics and Gynecology Prentice Docker, MD)  Dx:  Neutropenia, unspecified type ([place])  Ref Range & Units 3d ago  WBC 4.0 - 10.5 K/uL 4.0   RBC 3.87 - 5.11 MIL/uL 4.75   Hemoglobin 12.0 - 15.0 g/dL 13.8   HCT 36.0 - 46.0 % 41.6   MCV 80.0 - 100.0 fL 87.6   MCH 26.0 - 34.0 pg 29.1   MCHC 30.0 - 36.0 g/dL 33.2   RDW 11.5 - 15.5 % 16.0High    Platelets 150 - 400 K/uL 121Low    nRBC 0.0 - 0.2 % 0.0   Neutrophils Relative % % 34   Neutro Abs 1.7 - 7.7 K/uL 1.4Low    Lymphocytes Relative % 55   Lymphs Abs 0.7 - 4.0 K/uL 2.2   Monocytes Relative % 8   Monocytes Absolute 0.1 - 1.0 K/uL 0.3   Eosinophils Relative % 2   Eosinophils Absolute 0.0 - 0.5 K/uL 0.1   Basophils Relative % 1   Basophils Absolute 0.0 - 0.1 K/uL 0.0   Immature Granulocytes % 0   Abs Immature Granulocytes 0.00 - 0.07 K/uL 0.01   Comment: Performed at [facility], [address] [place], [place] [address]  Resulting Agency  [facility] LAB      Specimen Collected: 02/11/19 12:17  Last Resulted: 02/11/19 12:35     Lab Flowsheet   Order Details   View Encounter   Lab and Collection Details   Routing   Result History         Other Results from 02/11/2019  Contains abnormal data Comprehensive metabolic panel Order: [id number]  Status:  Final result  Visible to patient:  No (not released)  Next appt:  02/24/2019 at 09:30 AM in Obstetrics and Gynecology Prentice Docker, MD)  Dx:  Neutropenia, unspecified type ([place])  Ref Range & Units 3d ago  Sodium 135 - 145 mmol/L 140   Potassium 3.5 - 5.1 mmol/L 3.7   Chloride 98 - 111 mmol/L 107   CO2 22 - 32 mmol/L 26   Glucose, Bld 70 - 99 mg/dL 104High    BUN 8 - 23 mg/dL 18    Creatinine, Ser 0.44 - 1.00 mg/dL 0.85   Calcium 8.9 - 10.3 mg/dL 9.8   Total Protein 6.5 - 8.1 g/dL 8.2High    Albumin 3.5 - 5.0 g/dL 4.7   AST 15 - 41 U/L 26   ALT 0 - 44 U/L 21   Alkaline Phosphatase 38 - 126 U/L 52   Total Bilirubin 0.3 - 1.2 mg/dL 1.1   GFR calc non Af Amer >60 mL/min >60   GFR calc Af Amer >60 mL/min >60   Anion gap 5 - 15 7   Comment: Performed at [facility], [address] [place], [place] [address]  Resulting Agency  [facility] CLIN LAB      Specimen Collected: 02/11/19 12:17  Last Resulted: 02/11/19 12:44     Lab Flowsheet   Order Details   View Encounter   Lab and Collection Details   Routing   Result History           Lactate dehydrogenase Order: [id number]  Status:  Final result  Visible to patient:  No (not  released)  Next appt:  02/24/2019 at 09:30 AM in Obstetrics and Gynecology Prentice Docker, MD)  Dx:  Neutropenia, unspecified type ([place])  Ref Range & Units 3d ago  LDH 98 - 192 U/L 171   Comment: Performed at [facility], [address] [place], [place] [address]  Resulting Agency  [facility] LAB      Specimen Collected: 02/11/19 12:17  Last Resulted: 02/11/19 12:44     Lab Flowsheet   Order Details   View Encounter   Lab and Collection Details   Routing   Result History           Hepatitis C antibody Order: [id number]  Status:  Final result  Visible to patient:  No (not released)  Next appt:  02/24/2019 at 09:30 AM in Obstetrics and Gynecology Prentice Docker, MD)  Dx:  Neutropenia, unspecified type ([place])  Ref Range & Units 3d ago  HCV Ab NON REACTIVE NON REACTIVE   Comment: (NOTE)  Nonreactive HCV antibody screen is consistent with no HCV infections,  unless recent infection is suspected or other evidence exists to  indicate HCV infection.  Performed at [facility], [place] [name] [place], [place]  [address]   Resulting Agency  [facility] CLIN LAB      Specimen Collected: 02/11/19  12:17  Last Resulted: 02/11/19 17:03     Lab Flowsheet   Order Details   View Encounter   Lab and Collection Details   Routing   Result History           Hepatitis B surface antigen Order: [id number]  Status:  Final result  Visible to patient:  No (not released)  Next appt:  02/24/2019 at 09:30 AM in Obstetrics and Gynecology Prentice Docker, MD)  Dx:  Neutropenia, unspecified type ([place])  Ref Range & Units 3d ago  Hepatitis B Surface Ag NON REACTIVE NON REACTIVE   Comment: Performed at [facility], [place]. [address], [place],  [address]  Resulting Agency  [facility] CLIN LAB      Specimen Collected: 02/11/19 12:17  Last Resulted: 02/11/19 16:33     Lab Flowsheet   Order Details   View Encounter   Lab and Collection Details   Routing   Result History           Contains abnormal data Vitamin B12 Order: [id number]  Status:  Final result  Visible to patient:  No (not released)  Next appt:  02/24/2019 at 09:30 AM in Obstetrics and Gynecology Prentice Docker, MD)  Dx:  Neutropenia, unspecified type ([place])  Ref Range & Units 3d ago  Vitamin B-12 180 - 914 pg/mL 971High    Comment: (NOTE)  This assay is not validated for testing neonatal or  myeloproliferative syndrome specimens for Vitamin B12 levels.  Performed at [facility], [place] [address], [place],   [address]   Resulting Agency  [facility] CLIN LAB      Specimen Collected: 02/11/19 12:17  Last Resulted: 02/11/19 16:36     Lab Flowsheet   Order Details   View Encounter   Lab and Collection Details   Routing   Result History           Folate Order: [id number]  Status:  Final result  Visible to patient:  No (not released)  Next appt:  02/24/2019 at 09:30 AM in Obstetrics and Gynecology Prentice Docker, MD)  Dx:  Neutropenia, unspecified type [facility])  Ref Range & Units 3d ago  Folate >5.9 ng/mL 32.0   Comment: Performed at [facility], [address]  [place], [place] [address]  Resulting Agency  [facility] CLIN LAB      Specimen Collected: 02/11/19 12:17  Last Resulted: 02/11/19 14:08     Lab Flowsheet   Order Details   View Encounter   Lab and Collection Details   Routing   Result History           C-reactive protein Order: [id number]  Status:  Final result  Visible to patient:  No (not released)  Next appt:  02/24/2019 at 09:30 AM in Obstetrics and Gynecology Prentice Docker, MD)  Dx:  Neutropenia, unspecified type ([place])  Ref Range & Units 3d ago  CRP <1.0 mg/dL <0.8   Comment: Performed at [facility], [place]. [address] [place], [place] [address]  Resulting Agency  [facility] CLIN LAB      Specimen Collected: 02/11/19 12:17  Last Resulted: 02/11/19 16:36

## 2019-02-14 NOTE — Telephone Encounter (Signed)
Hassan Rowan- please relay information below.  -------------------------  Left a voicemail for the patient that her labs are stable-slightly low ANC; mild thrombocytopenia 120 stable.  Would not recommend any further work-up at this time.  Follow-up as planned

## 2019-02-24 ENCOUNTER — Ambulatory Visit: Payer: Medicare HMO | Admitting: Obstetrics and Gynecology

## 2019-03-13 ENCOUNTER — Ambulatory Visit
Admission: RE | Admit: 2019-03-13 | Discharge: 2019-03-13 | Disposition: A | Discharge status: Home / Self Care | Payer: Medicare HMO | Source: Ambulatory Visit | Attending: Family Medicine | Admitting: Family Medicine

## 2019-03-13 DIAGNOSIS — Z1231 Encounter for screening mammogram for malignant neoplasm of breast: Secondary | ICD-10-CM | POA: Insufficient documentation

## 2019-03-19 ENCOUNTER — Ambulatory Visit: Payer: Medicare HMO | Admitting: Obstetrics and Gynecology

## 2019-03-20 NOTE — Telephone Encounter (Signed)
Patient was reschedule to 03/19/19. Patient no Showed schedule appointment. Called and left voicemail for patient to call back to be schedule. Contact at [facility] about no showed appointment

## 2019-06-17 ENCOUNTER — Inpatient Hospital Stay: Payer: Medicare HMO

## 2019-06-17 ENCOUNTER — Inpatient Hospital Stay: Payer: Medicare HMO | Admitting: Internal Medicine

## 2020-06-01 DEATH — deceased
